# Patient Record
Sex: Female | Born: 1975 | Race: White | Hispanic: No | Smoking: Former smoker
Health system: Southern US, Community
[De-identification: ages and names within clinical notes are randomized; demographics above are authoritative.]

## PROBLEM LIST (undated history)

## (undated) ENCOUNTER — Inpatient Hospital Stay (HOSPITAL_COMMUNITY): Payer: Self-pay

## (undated) DIAGNOSIS — K589 Irritable bowel syndrome without diarrhea: Secondary | ICD-10-CM

## (undated) DIAGNOSIS — R51 Headache: Secondary | ICD-10-CM

## (undated) HISTORY — DX: Headache: R51

## (undated) HISTORY — PX: CYST REMOVAL HAND: SHX6279

## (undated) HISTORY — PX: CHOLECYSTECTOMY: SHX55

---

## 1997-09-30 ENCOUNTER — Inpatient Hospital Stay (HOSPITAL_COMMUNITY): Admission: EM | Admit: 1997-09-30 | Discharge: 1997-10-02 | Payer: Self-pay | Admitting: Emergency Medicine

## 1998-12-22 ENCOUNTER — Encounter: Payer: Self-pay | Admitting: *Deleted

## 1998-12-22 ENCOUNTER — Ambulatory Visit (HOSPITAL_COMMUNITY): Admission: RE | Admit: 1998-12-22 | Discharge: 1998-12-22 | Payer: Self-pay | Admitting: *Deleted

## 1999-01-08 ENCOUNTER — Inpatient Hospital Stay (HOSPITAL_COMMUNITY): Admission: AD | Admit: 1999-01-08 | Discharge: 1999-01-08 | Payer: Self-pay | Admitting: *Deleted

## 1999-03-20 ENCOUNTER — Inpatient Hospital Stay (HOSPITAL_COMMUNITY): Admission: AD | Admit: 1999-03-20 | Discharge: 1999-03-22 | Payer: Self-pay | Admitting: *Deleted

## 1999-03-20 ENCOUNTER — Encounter (HOSPITAL_COMMUNITY): Admission: RE | Admit: 1999-03-20 | Discharge: 1999-03-20 | Payer: Self-pay | Admitting: *Deleted

## 1999-03-20 ENCOUNTER — Inpatient Hospital Stay (HOSPITAL_COMMUNITY): Admission: AD | Admit: 1999-03-20 | Discharge: 1999-03-20 | Payer: Self-pay | Admitting: *Deleted

## 2000-10-29 ENCOUNTER — Emergency Department (HOSPITAL_COMMUNITY): Admission: EM | Admit: 2000-10-29 | Discharge: 2000-10-30 | Payer: Self-pay | Admitting: Emergency Medicine

## 2000-10-30 ENCOUNTER — Encounter: Payer: Self-pay | Admitting: Emergency Medicine

## 2000-10-30 ENCOUNTER — Ambulatory Visit (HOSPITAL_COMMUNITY): Admission: RE | Admit: 2000-10-30 | Discharge: 2000-10-30 | Payer: Self-pay | Admitting: Emergency Medicine

## 2000-11-02 ENCOUNTER — Observation Stay (HOSPITAL_COMMUNITY): Admission: RE | Admit: 2000-11-02 | Discharge: 2000-11-03 | Payer: Self-pay | Admitting: General Surgery

## 2002-01-25 HISTORY — PX: ANKLE SURGERY: SHX546

## 2004-02-29 ENCOUNTER — Inpatient Hospital Stay (HOSPITAL_COMMUNITY): Admission: EM | Admit: 2004-02-29 | Discharge: 2004-03-02 | Payer: Self-pay | Admitting: Emergency Medicine

## 2005-08-23 ENCOUNTER — Inpatient Hospital Stay (HOSPITAL_COMMUNITY): Admission: AD | Admit: 2005-08-23 | Discharge: 2005-08-23 | Payer: Self-pay | Admitting: Gynecology

## 2005-10-19 ENCOUNTER — Ambulatory Visit (HOSPITAL_COMMUNITY): Admission: RE | Admit: 2005-10-19 | Discharge: 2005-10-19 | Payer: Self-pay | Admitting: Obstetrics & Gynecology

## 2006-03-07 ENCOUNTER — Ambulatory Visit (HOSPITAL_COMMUNITY): Admission: RE | Admit: 2006-03-07 | Discharge: 2006-03-07 | Payer: Self-pay | Admitting: Obstetrics & Gynecology

## 2006-03-13 ENCOUNTER — Ambulatory Visit: Payer: Self-pay | Admitting: Obstetrics and Gynecology

## 2006-03-13 ENCOUNTER — Inpatient Hospital Stay (HOSPITAL_COMMUNITY): Admission: AD | Admit: 2006-03-13 | Discharge: 2006-03-15 | Payer: Self-pay | Admitting: Obstetrics and Gynecology

## 2010-11-22 ENCOUNTER — Inpatient Hospital Stay (HOSPITAL_COMMUNITY): Payer: Medicaid Other

## 2010-11-22 ENCOUNTER — Encounter (HOSPITAL_COMMUNITY): Payer: Self-pay | Admitting: *Deleted

## 2010-11-22 ENCOUNTER — Inpatient Hospital Stay (HOSPITAL_COMMUNITY)
Admission: AD | Admit: 2010-11-22 | Discharge: 2010-11-22 | Disposition: A | Discharge status: Home / Self Care | Payer: Medicaid Other | Source: Ambulatory Visit | Attending: Obstetrics & Gynecology | Admitting: Obstetrics & Gynecology

## 2010-11-22 DIAGNOSIS — O200 Threatened abortion: Secondary | ICD-10-CM

## 2010-11-22 DIAGNOSIS — O26859 Spotting complicating pregnancy, unspecified trimester: Secondary | ICD-10-CM | POA: Insufficient documentation

## 2010-11-22 HISTORY — DX: Irritable bowel syndrome, unspecified: K58.9

## 2010-11-22 LAB — ABO/RH: ABO/RH(D): A POS

## 2010-11-22 LAB — URINALYSIS, ROUTINE W REFLEX MICROSCOPIC
Bilirubin Urine: NEGATIVE
Leukocytes, UA: NEGATIVE
Nitrite: NEGATIVE
Specific Gravity, Urine: 1.01 (ref 1.005–1.030)
Urobilinogen, UA: 0.2 mg/dL (ref 0.0–1.0)
pH: 7 (ref 5.0–8.0)

## 2010-11-22 LAB — WET PREP, GENITAL
Trich, Wet Prep: NONE SEEN
Yeast Wet Prep HPF POC: NONE SEEN

## 2010-11-22 LAB — POCT PREGNANCY, URINE: Preg Test, Ur: POSITIVE

## 2010-11-22 NOTE — Progress Notes (Signed)
Pt presents to [facility] with chief complaint of brown spotting, left sided pain. Pt is early pregnant, 6 weeks.

## 2010-11-22 NOTE — Progress Notes (Signed)
Pt reprots having some spotting today when she wiped and having some back pain /cramping

## 2010-11-22 NOTE — ED Provider Notes (Signed)
History   Christine Chen is a 35 y.o. year old [id number] female at [redacted]w[redacted]d weeks gestation  By LMP who presents to [facility] reporting spotting and left sided and LBP. She denies passage of tissue. She has not had any US's this pregnancy.   CSN: [id number] Arrival date & time: 11/22/2010 11:01 AM   None     Chief Complaint  Patient presents with  . Vaginal Bleeding   (Consider location/radiation/quality/duration/timing/severity/associated sxs/prior treatment) HPI  Past Medical History  Diagnosis Date  . IBS (irritable bowel syndrome)     Past Surgical History  Procedure Date  . Cholecystectomy     No family history on file.  History  Substance Use Topics  . Smoking status: Current Everyday Smoker -- 0.2 packs/day  . Smokeless tobacco: Not on file  . Alcohol Use: No    OB History    Grav Para Term Preterm Abortions TAB SAB Ect Mult Living   5 3 3  0 1 0 1 0 0 3      Review of Systems: neg for urgency, frequency, dysuria, fever, chills, abd pain  Allergies  Review of patient's allergies indicates no known allergies.  Home Medications  No current outpatient prescriptions on file.  BP 132/80  Pulse 79  Temp(Src) 98.1 F (36.7 C) (Oral)  Resp 18  Ht 5\' 2"  (1.575 m)  Wt 90.719 kg (200 lb)  BMI 36.58 kg/m2  LMP 10/08/2010  Physical Exam General: NAD, A&O x 4 Abd, NT Pelvic: BUS normal. Vagina + scant brown discharge, no odor. Cervix closed, non-friable. No adnexal masses or tenderness.  US Ob Transvaginal  11/22/2010  *RADIOLOGY REPORT*  Clinical Data: Pregnant, spotting, left lower quadrant pain  OBSTETRIC <14 WK [place] AND TRANSVAGINAL OB US  Technique:  Both transabdominal and transvaginal ultrasound examinations were performed for complete evaluation of the gestation as well as the maternal uterus, adnexal regions, and pelvic cul-de-sac.  Transvaginal technique was performed to assess early pregnancy.  Comparison:  None.  Intrauterine gestational sac:   Visualized/normal in shape. Yolk sac: Present Embryo: Not visualized  MSD: 5.4 mm, corresponding to an estimated gestational age [redacted] weeks 1 day.  [place] EDC: 07/24/2011  Maternal uterus/adnexae: Gravid uterus is notable for a 1.3 x 1.2 x 1.8 cm intramural posterior uterine body fibroid.  No subchorionic hemorrhage.  Left ovary measures 1.7 x 2.0 x 2.7 cm and is notable for a corpus luteal cyst.  Right ovary measures 1.5 x 2.2 x 3.0 cm and is within normal limits.  No free fluid.  IMPRESSION: Single intrauterine gestational sac with estimated gestational age [redacted] weeks 1 day by crown-rump length.  Original Report Authenticated By: Charline Bills, M.D.    ED Course  Procedures (including critical care time)  Results for orders placed during the hospital encounter of 11/22/10 (from the past 48 hour(s))  URINALYSIS, ROUTINE W REFLEX MICROSCOPIC     Status: Normal   Collection Time   11/22/10 11:35 AM      Component Value Range Comment   Color, Urine YELLOW  YELLOW     Appearance CLEAR  CLEAR     Specific Gravity, Urine 1.010  1.005 - 1.030     pH 7.0  5.0 - 8.0     Glucose, UA NEGATIVE  NEGATIVE (mg/dL)    Hgb urine dipstick NEGATIVE  NEGATIVE     Bilirubin Urine NEGATIVE  NEGATIVE     Ketones, ur NEGATIVE  NEGATIVE (mg/dL)    Protein, ur NEGATIVE  NEGATIVE (mg/dL)    Urobilinogen, UA 0.2  0.0 - 1.0 (mg/dL)    Nitrite NEGATIVE  NEGATIVE     Leukocytes, UA NEGATIVE  NEGATIVE  MICROSCOPIC NOT DONE ON URINES WITH NEGATIVE PROTEIN, BLOOD, LEUKOCYTES, NITRITE, OR GLUCOSE <1000 mg/dL.  HCG, QUANTITATIVE, PREGNANCY     Status: Abnormal   Collection Time   11/22/10 11:41 AM      Component Value Range Comment   hCG, Beta Chain, Quant, S 3646 (*) <5 (mIU/mL)   ABO/RH     Status: Normal   Collection Time   11/22/10 11:41 AM      Component Value Range Comment   ABO/RH(D) A POS     POCT PREGNANCY, URINE     Status: Normal   Collection Time   11/22/10 11:42 AM      Component Value Range Comment    Preg Test, Ur POSITIVE     WET PREP, GENITAL     Status: Abnormal   Collection Time   11/22/10  2:21 PM      Component Value Range Comment   Yeast, Wet Prep NONE SEEN  NONE SEEN     Trich, Wet Prep NONE SEEN  NONE SEEN     Clue Cells, Wet Prep NONE SEEN  NONE SEEN     WBC, Wet Prep HPF POC MODERATE (*) NONE SEEN  MANY BACTERIA SEEN     MDM  Assessment: 1. 5.1 week IUP, viability not yet confirmed 2. Spotting of unknown etiology  Plan: 1. D/C home 2. Start prenatal care 3. F/U in [facility] PRN for worsening of Sx. 4. SAB precautions 5. GC/CT pending 6. Pelvic rest x 1 week  Dorathy Kinsman 11/22/2010 5:11 PM

## 2011-02-08 ENCOUNTER — Other Ambulatory Visit: Payer: Self-pay | Admitting: Family Medicine

## 2011-02-08 ENCOUNTER — Ambulatory Visit: Payer: BLUE CROSS/BLUE SHIELD | Admitting: Gynecology

## 2011-02-08 VITALS — BP 136/78 | Wt 210.0 lb

## 2011-02-08 DIAGNOSIS — O09519 Supervision of elderly primigravida, unspecified trimester: Secondary | ICD-10-CM

## 2011-02-08 DIAGNOSIS — Z3480 Encounter for supervision of other normal pregnancy, unspecified trimester: Secondary | ICD-10-CM

## 2011-02-09 LAB — OBSTETRIC PANEL
Basophils Relative: 0 % (ref 0–1)
Eosinophils Absolute: 0.1 10*3/uL (ref 0.0–0.7)
Eosinophils Relative: 2 % (ref 0–5)
Lymphs Abs: 1.6 10*3/uL (ref 0.7–4.0)
MCH: 28 pg (ref 26.0–34.0)
MCHC: 32.2 g/dL (ref 30.0–36.0)
MCV: 87.1 fL (ref 78.0–100.0)
Platelets: 211 10*3/uL (ref 150–400)
RBC: 4.25 MIL/uL (ref 3.87–5.11)
Rh Type: POSITIVE

## 2011-02-09 LAB — HEPATITIS B SURFACE ANTIBODY,QUALITATIVE: Hep B S Ab: NEGATIVE

## 2011-02-09 LAB — HIV ANTIBODY (ROUTINE TESTING W REFLEX): HIV: NONREACTIVE

## 2011-02-10 LAB — CULTURE, URINE COMPREHENSIVE: Colony Count: NO GROWTH

## 2011-02-15 ENCOUNTER — Telehealth: Payer: Self-pay | Admitting: *Deleted

## 2011-02-16 ENCOUNTER — Ambulatory Visit (INDEPENDENT_AMBULATORY_CARE_PROVIDER_SITE_OTHER): Payer: Medicaid Other | Admitting: Obstetrics and Gynecology

## 2011-02-16 ENCOUNTER — Other Ambulatory Visit (HOSPITAL_COMMUNITY)
Admission: RE | Admit: 2011-02-16 | Discharge: 2011-02-16 | Disposition: A | Discharge status: Home / Self Care | Payer: Medicaid Other | Source: Ambulatory Visit | Attending: Obstetrics and Gynecology | Admitting: Obstetrics and Gynecology

## 2011-02-16 VITALS — BP 113/77 | Wt 209.0 lb

## 2011-02-16 DIAGNOSIS — Z113 Encounter for screening for infections with a predominantly sexual mode of transmission: Secondary | ICD-10-CM | POA: Insufficient documentation

## 2011-02-16 DIAGNOSIS — Z01419 Encounter for gynecological examination (general) (routine) without abnormal findings: Secondary | ICD-10-CM | POA: Insufficient documentation

## 2011-02-16 DIAGNOSIS — Z3480 Encounter for supervision of other normal pregnancy, unspecified trimester: Secondary | ICD-10-CM

## 2011-02-16 DIAGNOSIS — O09529 Supervision of elderly multigravida, unspecified trimester: Secondary | ICD-10-CM | POA: Insufficient documentation
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
## 2011-02-16 NOTE — Patient Instructions (Signed)
Pregnancy - Second Trimester The second trimester of pregnancy (3 to 6 months) is a period of rapid growth for you and your baby. At the end of the sixth month, your baby is about 9 inches long and weighs 1 1/2 pounds. You will begin to feel the baby move between 18 and 20 weeks of the pregnancy. This is called quickening. Weight gain is faster. A clear fluid (colostrum) may leak out of your breasts. You may feel small contractions of the womb (uterus). This is known as false labor or Braxton-Hicks contractions. This is like a practice for labor when the baby is ready to be born. Usually, the problems with morning sickness have usually passed by the end of your first trimester. Some women develop small dark blotches (called cholasma, mask of pregnancy) on their face that usually goes away after the baby is born. Exposure to the sun makes the blotches worse. Acne may also develop in some pregnant women and pregnant women who have acne, may find that it goes away. PRENATAL EXAMS  Blood work may continue to be done during prenatal exams. These tests are done to check on your health and the probable health of your baby. Blood work is used to follow your blood levels (hemoglobin). Anemia (low hemoglobin) is common during pregnancy. Iron and vitamins are given to help prevent this. You will also be checked for diabetes between 24 and 28 weeks of the pregnancy. Some of the previous blood tests may be repeated.   The size of the uterus is measured during each visit. This is to make sure that the baby is continuing to grow properly according to the dates of the pregnancy.   Your blood pressure is checked every prenatal visit. This is to make sure you are not getting toxemia.   Your urine is checked to make sure you do not have an infection, diabetes or protein in the urine.   Your weight is checked often to make sure gains are happening at the suggested rate. This is to ensure that both you and your baby are  growing normally.   Sometimes, an ultrasound is performed to confirm the proper growth and development of the baby. This is a test which bounces harmless sound waves off the baby so your caregiver can more accurately determine due dates.  Sometimes, a specialized test is done on the amniotic fluid surrounding the baby. This test is called an amniocentesis. The amniotic fluid is obtained by sticking a needle into the belly (abdomen). This is done to check the chromosomes in instances where there is a concern about possible genetic problems with the baby. It is also sometimes done near the end of pregnancy if an early delivery is required. In this case, it is done to help make sure the baby's lungs are mature enough for the baby to live outside of the womb. CHANGES OCCURING IN THE SECOND TRIMESTER OF PREGNANCY Your body goes through many changes during pregnancy. They vary from person to person. Talk to your caregiver about changes you notice that you are concerned about.  During the second trimester, you will likely have an increase in your appetite. It is normal to have cravings for certain foods. This varies from person to person and pregnancy to pregnancy.   Your lower abdomen will begin to bulge.   You may have to urinate more often because the uterus and baby are pressing on your bladder. It is also common to get more bladder infections during pregnancy (  pain with urination). You can help this by drinking lots of fluids and emptying your bladder before and after intercourse.   You may begin to get stretch marks on your hips, abdomen, and breasts. These are normal changes in the body during pregnancy. There are no exercises or medications to take that prevent this change.   You may begin to develop swollen and bulging veins (varicose veins) in your legs. Wearing support hose, elevating your feet for 15 minutes, 3 to 4 times a day and limiting salt in your diet helps lessen the problem.    Heartburn may develop as the uterus grows and pushes up against the stomach. Antacids recommended by your caregiver helps with this problem. Also, eating smaller meals 4 to 5 times a day helps.   Constipation can be treated with a stool softener or adding bulk to your diet. Drinking lots of fluids, vegetables, fruits, and whole grains are helpful.   Exercising is also helpful. If you have been very active up until your pregnancy, most of these activities can be continued during your pregnancy. If you have been less active, it is helpful to start an exercise program such as walking.   Hemorrhoids (varicose veins in the rectum) may develop at the end of the second trimester. Warm sitz baths and hemorrhoid cream recommended by your caregiver helps hemorrhoid problems.   Backaches may develop during this time of your pregnancy. Avoid heavy lifting, wear low heal shoes and practice good posture to help with backache problems.   Some pregnant women develop tingling and numbness of their hand and fingers because of swelling and tightening of ligaments in the wrist (carpel tunnel syndrome). This goes away after the baby is born.   As your breasts enlarge, you may have to get a bigger bra. Get a comfortable, cotton, support bra. Do not get a nursing bra until the last month of the pregnancy if you will be nursing the baby.   You may get a dark line from your belly button to the pubic area called the linea nigra.   You may develop rosy cheeks because of increase blood flow to the face.   You may develop spider looking lines of the face, neck, arms and chest. These go away after the baby is born.  HOME CARE INSTRUCTIONS   It is extremely important to avoid all smoking, herbs, alcohol, and unprescribed drugs during your pregnancy. These chemicals affect the formation and growth of the baby. Avoid these chemicals throughout the pregnancy to ensure the delivery of a healthy infant.   Most of your home  care instructions are the same as suggested for the first trimester of your pregnancy. Keep your caregiver's appointments. Follow your caregiver's instructions regarding medication use, exercise and diet.   During pregnancy, you are providing food for you and your baby. Continue to eat regular, well-balanced meals. Choose foods such as meat, fish, milk and other low fat dairy products, vegetables, fruits, and whole-grain breads and cereals. Your caregiver will tell you of the ideal weight gain.   A physical sexual relationship may be continued up until near the end of pregnancy if there are no other problems. Problems could include early (premature) leaking of amniotic fluid from the membranes, vaginal bleeding, abdominal pain, or other medical or pregnancy problems.   Exercise regularly if there are no restrictions. Check with your caregiver if you are unsure of the safety of some of your exercises. The greatest weight gain will occur in the   last 2 trimesters of pregnancy. Exercise will help you:   Control your weight.   Get you in shape for labor and delivery.   Lose weight after you have the baby.   Wear a good support or jogging bra for breast tenderness during pregnancy. This may help if worn during sleep. Pads or tissues may be used in the bra if you are leaking colostrum.   Do not use hot tubs, steam rooms or saunas throughout the pregnancy.   Wear your seat belt at all times when driving. This protects you and your baby if you are in an accident.   Avoid raw meat, uncooked cheese, cat litter boxes and soil used by cats. These carry germs that can cause birth defects in the baby.   The second trimester is also a good time to visit your dentist for your dental health if this has not been done yet. Getting your teeth cleaned is OK. Use a soft toothbrush. Brush gently during pregnancy.   It is easier to loose urine during pregnancy. Tightening up and strengthening the pelvic muscles will  help with this problem. Practice stopping your urination while you are going to the bathroom. These are the same muscles you need to strengthen. It is also the muscles you would use as if you were trying to stop from passing gas. You can practice tightening these muscles up 10 times a set and repeating this about 3 times per day. Once you know what muscles to tighten up, do not perform these exercises during urination. It is more likely to contribute to an infection by backing up the urine.   Ask for help if you have financial, counseling or nutritional needs during pregnancy. Your caregiver will be able to offer counseling for these needs as well as refer you for other special needs.   Your skin may become oily. If so, wash your face with mild soap, use non-greasy moisturizer and oil or cream based makeup.  MEDICATIONS AND DRUG USE IN PREGNANCY  Take prenatal vitamins as directed. The vitamin should contain 1 milligram of folic acid. Keep all vitamins out of reach of children. Only a couple vitamins or tablets containing iron may be fatal to a baby or young child when ingested.   Avoid use of all medications, including herbs, over-the-counter medications, not prescribed or suggested by your caregiver. Only take over-the-counter or prescription medicines for pain, discomfort, or fever as directed by your caregiver. Do not use aspirin.   Let your caregiver also know about herbs you may be using.   Alcohol is related to a number of birth defects. This includes fetal alcohol syndrome. All alcohol, in any form, should be avoided completely. Smoking will cause low birth rate and premature babies.   Street or illegal drugs are very harmful to the baby. They are absolutely forbidden. A baby born to an addicted mother will be addicted at birth. The baby will go through the same withdrawal an adult does.  SEEK MEDICAL CARE IF:  You have any concerns or worries during your pregnancy. It is better to call with  your questions if you feel they cannot wait, rather than worry about them. SEEK IMMEDIATE MEDICAL CARE IF:   An unexplained oral temperature above 102 F (38.9 C) develops, or as your caregiver suggests.   You have leaking of fluid from the vagina (birth canal). If leaking membranes are suspected, take your temperature and tell your caregiver of this when you call.   There   is vaginal spotting, bleeding, or passing clots. Tell your caregiver of the amount and how many pads are used. Light spotting in pregnancy is common, especially following intercourse.   You develop a bad smelling vaginal discharge with a change in the color from clear to white.   You continue to feel sick to your stomach (nauseated) and have no relief from remedies suggested. You vomit blood or coffee ground-like materials.   You lose more than 2 pounds of weight or gain more than 2 pounds of weight over 1 week, or as suggested by your caregiver.   You notice swelling of your face, hands, feet, or legs.   You get exposed to German measles and have never had them.   You are exposed to fifth disease or chickenpox.   You develop belly (abdominal) pain. Round ligament discomfort is a common non-cancerous (benign) cause of abdominal pain in pregnancy. Your caregiver still must evaluate you.   You develop a bad headache that does not go away.   You develop fever, diarrhea, pain with urination, or shortness of breath.   You develop visual problems, blurry, or double vision.   You fall or are in a car accident or any kind of trauma.   There is mental or physical violence at home.  Document Released: 01/05/2001 Document Revised: 09/23/2010 Document Reviewed: 07/10/2008 [employer] Patient Information 2012 [employer]. 

## 2011-02-17 NOTE — Progress Notes (Signed)
Added AFP Quad screen, test ordered was AFP only.

## 2011-02-19 LAB — AFP, QUAD SCREEN
AFP: 38.5 IU/mL
Down Syndrome Scr Risk Est: 1:7850 {titer}
HCG, Total: 13695 m[IU]/mL
Interpretation-AFP: NEGATIVE
MoM for AFP: 1.29
MoM for hCG: 0.98
Open Spina bifida: NEGATIVE
Tri 18 Scr Risk Est: NEGATIVE
uE3 Mom: 1.03

## 2011-02-25 NOTE — Telephone Encounter (Signed)
Quad screen was added to patients lab/ results are to be faxed over by the lab.

## 2011-03-01 ENCOUNTER — Ambulatory Visit (HOSPITAL_COMMUNITY)
Admission: RE | Admit: 2011-03-01 | Discharge: 2011-03-01 | Disposition: A | Discharge status: Home / Self Care | Payer: Medicaid Other | Source: Ambulatory Visit | Attending: Obstetrics and Gynecology | Admitting: Obstetrics and Gynecology

## 2011-03-01 DIAGNOSIS — Z363 Encounter for antenatal screening for malformations: Secondary | ICD-10-CM | POA: Insufficient documentation

## 2011-03-01 DIAGNOSIS — O358XX0 Maternal care for other (suspected) fetal abnormality and damage, not applicable or unspecified: Secondary | ICD-10-CM | POA: Insufficient documentation

## 2011-03-01 DIAGNOSIS — Z3480 Encounter for supervision of other normal pregnancy, unspecified trimester: Secondary | ICD-10-CM

## 2011-03-01 DIAGNOSIS — Z1389 Encounter for screening for other disorder: Secondary | ICD-10-CM | POA: Insufficient documentation

## 2011-03-04 ENCOUNTER — Encounter: Payer: Self-pay | Admitting: Obstetrics and Gynecology

## 2011-03-08 ENCOUNTER — Encounter (HOSPITAL_COMMUNITY): Payer: Self-pay | Admitting: *Deleted

## 2011-03-08 ENCOUNTER — Inpatient Hospital Stay (HOSPITAL_COMMUNITY)
Admission: AD | Admit: 2011-03-08 | Discharge: 2011-03-08 | Disposition: A | Discharge status: Home / Self Care | Payer: Medicaid Other | Source: Ambulatory Visit | Attending: Obstetrics & Gynecology | Admitting: Obstetrics & Gynecology

## 2011-03-08 DIAGNOSIS — M7918 Myalgia, other site: Secondary | ICD-10-CM

## 2011-03-08 DIAGNOSIS — M549 Dorsalgia, unspecified: Secondary | ICD-10-CM | POA: Insufficient documentation

## 2011-03-08 DIAGNOSIS — O99891 Other specified diseases and conditions complicating pregnancy: Secondary | ICD-10-CM | POA: Insufficient documentation

## 2011-03-08 DIAGNOSIS — IMO0001 Reserved for inherently not codable concepts without codable children: Secondary | ICD-10-CM

## 2011-03-08 LAB — URINALYSIS, ROUTINE W REFLEX MICROSCOPIC
Glucose, UA: NEGATIVE mg/dL
Hgb urine dipstick: NEGATIVE
Ketones, ur: >80 mg/dL — AB
Leukocytes, UA: NEGATIVE
pH: 5.5 (ref 5.0–8.0)

## 2011-03-08 MED ORDER — CYCLOBENZAPRINE HCL 5 MG PO TABS: 5.0000 mg | ORAL_TABLET | Freq: Two times a day (BID) | ORAL | Status: AC | PRN

## 2011-03-08 MED ORDER — IBUPROFEN 600 MG PO TABS: 600.0000 mg | ORAL_TABLET | Freq: Four times a day (QID) | ORAL | Status: AC | PRN
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
## 2011-03-08 NOTE — Progress Notes (Signed)
Back pains, initially started on Sat.  Has continued, worse with movement.  Has tried tylenol, no relief.  Denies any urinary symptoms.

## 2011-03-16 ENCOUNTER — Encounter: Payer: Self-pay | Admitting: Obstetrics and Gynecology

## 2011-03-17 ENCOUNTER — Ambulatory Visit (INDEPENDENT_AMBULATORY_CARE_PROVIDER_SITE_OTHER): Payer: Medicaid Other | Admitting: Obstetrics & Gynecology

## 2011-03-17 VITALS — BP 101/61 | Wt 213.0 lb

## 2011-03-17 DIAGNOSIS — Z3480 Encounter for supervision of other normal pregnancy, unspecified trimester: Secondary | ICD-10-CM

## 2011-03-17 NOTE — Patient Instructions (Signed)
Return to clinic for any obstetric concerns or go to [facility]U for evaluation  

## 2011-03-17 NOTE — Progress Notes (Signed)
Patient is here for routine prenatal check she was seen at the hospital for a pulled muscle in her back this past Monday.  She was seen at [facility] hospital.

## 2011-03-17 NOTE — Progress Notes (Signed)
Back pain being treated with Flexeril and analgesia.  No other complaints or concerns.  Preterm labor precautions reviewed.

## 2011-04-13 ENCOUNTER — Ambulatory Visit (INDEPENDENT_AMBULATORY_CARE_PROVIDER_SITE_OTHER): Payer: Medicaid Other | Admitting: Family Medicine

## 2011-04-13 DIAGNOSIS — Z3480 Encounter for supervision of other normal pregnancy, unspecified trimester: Secondary | ICD-10-CM

## 2011-04-13 NOTE — Progress Notes (Signed)
Doing well---No issues, 28 wk labs next.  Interested in BTL, will sign papers next visit.

## 2011-04-13 NOTE — Patient Instructions (Addendum)
Pregnancy - Second Trimester The second trimester of pregnancy (3 to 6 months) is a period of rapid growth for you and your baby. At the end of the sixth month, your baby is about 9 inches long and weighs 1 1/2 pounds. You will begin to feel the baby move between 18 and 20 weeks of the pregnancy. This is called quickening. Weight gain is faster. A clear fluid (colostrum) may leak out of your breasts. You may feel small contractions of the womb (uterus). This is known as false labor or Braxton-Hicks contractions. This is like a practice for labor when the baby is ready to be born. Usually, the problems with morning sickness have usually passed by the end of your first trimester. Some women develop small dark blotches (called cholasma, mask of pregnancy) on their face that usually goes away after the baby is born. Exposure to the sun makes the blotches worse. Acne may also develop in some pregnant women and pregnant women who have acne, may find that it goes away. PRENATAL EXAMS  Blood work may continue to be done during prenatal exams. These tests are done to check on your health and the probable health of your baby. Blood work is used to follow your blood levels (hemoglobin). Anemia (low hemoglobin) is common during pregnancy. Iron and vitamins are given to help prevent this. You will also be checked for diabetes between 24 and 28 weeks of the pregnancy. Some of the previous blood tests may be repeated.   The size of the uterus is measured during each visit. This is to make sure that the baby is continuing to grow properly according to the dates of the pregnancy.   Your blood pressure is checked every prenatal visit. This is to make sure you are not getting toxemia.   Your urine is checked to make sure you do not have an infection, diabetes or protein in the urine.   Your weight is checked often to make sure gains are happening at the suggested rate. This is to ensure that both you and your baby are  growing normally.   Sometimes, an ultrasound is performed to confirm the proper growth and development of the baby. This is a test which bounces harmless sound waves off the baby so your caregiver can more accurately determine due dates.  Sometimes, a specialized test is done on the amniotic fluid surrounding the baby. This test is called an amniocentesis. The amniotic fluid is obtained by sticking a needle into the belly (abdomen). This is done to check the chromosomes in instances where there is a concern about possible genetic problems with the baby. It is also sometimes done near the end of pregnancy if an early delivery is required. In this case, it is done to help make sure the baby's lungs are mature enough for the baby to live outside of the womb. CHANGES OCCURING IN THE SECOND TRIMESTER OF PREGNANCY Your body goes through many changes during pregnancy. They vary from person to person. Talk to your caregiver about changes you notice that you are concerned about.  During the second trimester, you will likely have an increase in your appetite. It is normal to have cravings for certain foods. This varies from person to person and pregnancy to pregnancy.   Your lower abdomen will begin to bulge.   You may have to urinate more often because the uterus and baby are pressing on your bladder. It is also common to get more bladder infections during pregnancy (  pain with urination). You can help this by drinking lots of fluids and emptying your bladder before and after intercourse.   You may begin to get stretch marks on your hips, abdomen, and breasts. These are normal changes in the body during pregnancy. There are no exercises or medications to take that prevent this change.   You may begin to develop swollen and bulging veins (varicose veins) in your legs. Wearing support hose, elevating your feet for 15 minutes, 3 to 4 times a day and limiting salt in your diet helps lessen the problem.    Heartburn may develop as the uterus grows and pushes up against the stomach. Antacids recommended by your caregiver helps with this problem. Also, eating smaller meals 4 to 5 times a day helps.   Constipation can be treated with a stool softener or adding bulk to your diet. Drinking lots of fluids, vegetables, fruits, and whole grains are helpful.   Exercising is also helpful. If you have been very active up until your pregnancy, most of these activities can be continued during your pregnancy. If you have been less active, it is helpful to start an exercise program such as walking.   Hemorrhoids (varicose veins in the rectum) may develop at the end of the second trimester. Warm sitz baths and hemorrhoid cream recommended by your caregiver helps hemorrhoid problems.   Backaches may develop during this time of your pregnancy. Avoid heavy lifting, wear low heal shoes and practice good posture to help with backache problems.   Some pregnant women develop tingling and numbness of their hand and fingers because of swelling and tightening of ligaments in the wrist (carpel tunnel syndrome). This goes away after the baby is born.   As your breasts enlarge, you may have to get a bigger bra. Get a comfortable, cotton, support bra. Do not get a nursing bra until the last month of the pregnancy if you will be nursing the baby.   You may get a dark line from your belly button to the pubic area called the linea nigra.   You may develop rosy cheeks because of increase blood flow to the face.   You may develop spider looking lines of the face, neck, arms and chest. These go away after the baby is born.  HOME CARE INSTRUCTIONS   It is extremely important to avoid all smoking, herbs, alcohol, and unprescribed drugs during your pregnancy. These chemicals affect the formation and growth of the baby. Avoid these chemicals throughout the pregnancy to ensure the delivery of a healthy infant.   Most of your home  care instructions are the same as suggested for the first trimester of your pregnancy. Keep your caregiver's appointments. Follow your caregiver's instructions regarding medication use, exercise and diet.   During pregnancy, you are providing food for you and your baby. Continue to eat regular, well-balanced meals. Choose foods such as meat, fish, milk and other low fat dairy products, vegetables, fruits, and whole-grain breads and cereals. Your caregiver will tell you of the ideal weight gain.   A physical sexual relationship may be continued up until near the end of pregnancy if there are no other problems. Problems could include early (premature) leaking of amniotic fluid from the membranes, vaginal bleeding, abdominal pain, or other medical or pregnancy problems.   Exercise regularly if there are no restrictions. Check with your caregiver if you are unsure of the safety of some of your exercises. The greatest weight gain will occur in the   last 2 trimesters of pregnancy. Exercise will help you:   Control your weight.   Get you in shape for labor and delivery.   Lose weight after you have the baby.   Wear a good support or jogging bra for breast tenderness during pregnancy. This may help if worn during sleep. Pads or tissues may be used in the bra if you are leaking colostrum.   Do not use hot tubs, steam rooms or saunas throughout the pregnancy.   Wear your seat belt at all times when driving. This protects you and your baby if you are in an accident.   Avoid raw meat, uncooked cheese, cat litter boxes and soil used by cats. These carry germs that can cause birth defects in the baby.   The second trimester is also a good time to visit your dentist for your dental health if this has not been done yet. Getting your teeth cleaned is OK. Use a soft toothbrush. Brush gently during pregnancy.   It is easier to loose urine during pregnancy. Tightening up and strengthening the pelvic muscles will  help with this problem. Practice stopping your urination while you are going to the bathroom. These are the same muscles you need to strengthen. It is also the muscles you would use as if you were trying to stop from passing gas. You can practice tightening these muscles up 10 times a set and repeating this about 3 times per day. Once you know what muscles to tighten up, do not perform these exercises during urination. It is more likely to contribute to an infection by backing up the urine.   Ask for help if you have financial, counseling or nutritional needs during pregnancy. Your caregiver will be able to offer counseling for these needs as well as refer you for other special needs.   Your skin may become oily. If so, wash your face with mild soap, use non-greasy moisturizer and oil or cream based makeup.  MEDICATIONS AND DRUG USE IN PREGNANCY  Take prenatal vitamins as directed. The vitamin should contain 1 milligram of folic acid. Keep all vitamins out of reach of children. Only a couple vitamins or tablets containing iron may be fatal to a baby or young child when ingested.   Avoid use of all medications, including herbs, over-the-counter medications, not prescribed or suggested by your caregiver. Only take over-the-counter or prescription medicines for pain, discomfort, or fever as directed by your caregiver. Do not use aspirin.   Let your caregiver also know about herbs you may be using.   Alcohol is related to a number of birth defects. This includes fetal alcohol syndrome. All alcohol, in any form, should be avoided completely. Smoking will cause low birth rate and premature babies.   Street or illegal drugs are very harmful to the baby. They are absolutely forbidden. A baby born to an addicted mother will be addicted at birth. The baby will go through the same withdrawal an adult does.  SEEK MEDICAL CARE IF:  You have any concerns or worries during your pregnancy. It is better to call with  your questions if you feel they cannot wait, rather than worry about them. SEEK IMMEDIATE MEDICAL CARE IF:   An unexplained oral temperature above 102 F (38.9 C) develops, or as your caregiver suggests.   You have leaking of fluid from the vagina (birth canal). If leaking membranes are suspected, take your temperature and tell your caregiver of this when you call.   There   is vaginal spotting, bleeding, or passing clots. Tell your caregiver of the amount and how many pads are used. Light spotting in pregnancy is common, especially following intercourse.   You develop a bad smelling vaginal discharge with a change in the color from clear to white.   You continue to feel sick to your stomach (nauseated) and have no relief from remedies suggested. You vomit blood or coffee ground-like materials.   You lose more than 2 pounds of weight or gain more than 2 pounds of weight over 1 week, or as suggested by your caregiver.   You notice swelling of your face, hands, feet, or legs.   You get exposed to German measles and have never had them.   You are exposed to fifth disease or chickenpox.   You develop belly (abdominal) pain. Round ligament discomfort is a common non-cancerous (benign) cause of abdominal pain in pregnancy. Your caregiver still must evaluate you.   You develop a bad headache that does not go away.   You develop fever, diarrhea, pain with urination, or shortness of breath.   You develop visual problems, blurry, or double vision.   You fall or are in a car accident or any kind of trauma.   There is mental or physical violence at home.  Document Released: 01/05/2001 Document Revised: 12/31/2010 Document Reviewed: 07/10/2008 [employer] Patient Information 2012 [employer]. Contraception Choices Contraception (birth control) is the use of any methods or devices to prevent pregnancy. Below are some methods to help avoid pregnancy. HORMONAL METHODS   Contraceptive implant.  This is a thin, plastic tube containing progesterone hormone. It does not contain estrogen hormone. Your caregiver inserts the tube in the inner part of the upper arm. The tube can remain in place for up to 3 years. After 3 years, the implant must be removed. The implant prevents the ovaries from releasing an egg (ovulation), thickens the cervical mucus which prevents sperm from entering the uterus, and thins the lining of the inside of the uterus.   Progesterone-only injections. These injections are given every 3 months by your caregiver to prevent pregnancy. This synthetic progesterone hormone stops the ovaries from releasing eggs. It also thickens cervical mucus and changes the uterine lining. This makes it harder for sperm to survive in the uterus.   Birth control pills. These pills contain estrogen and progesterone hormone. They work by stopping the egg from forming in the ovary (ovulation). Birth control pills are prescribed by a caregiver.Birth control pills can also be used to treat heavy periods.   Minipill. This type of birth control pill contains only the progesterone hormone. They are taken every day of each month and must be prescribed by your caregiver.   Birth control patch. The patch contains hormones similar to those in birth control pills. It must be changed once a week and is prescribed by a caregiver.   Vaginal ring. The ring contains hormones similar to those in birth control pills. It is left in the vagina for 3 weeks, removed for 1 week, and then a new one is put back in place. The patient must be comfortable inserting and removing the ring from the vagina.A caregiver's prescription is necessary.   Emergency contraception. Emergency contraceptives prevent pregnancy after unprotected sexual intercourse. This pill can be taken right after sex or up to 5 days after unprotected sex. It is most effective the sooner you take the pills after having sexual intercourse. Emergency  contraceptive pills are available without a prescription.   Check with your pharmacist. Do not use emergency contraception as your only form of birth control.  BARRIER METHODS   Female condom. This is a thin sheath (latex or rubber) that is worn over the penis during sexual intercourse. It can be used with spermicide to increase effectiveness.   Female condom. This is a soft, loose-fitting sheath that is put into the vagina before sexual intercourse.   Diaphragm. This is a soft, latex, dome-shaped barrier that must be fitted by a caregiver. It is inserted into the vagina, along with a spermicidal jelly. It is inserted before intercourse. The diaphragm should be left in the vagina for 6 to 8 hours after intercourse.   Cervical cap. This is a round, soft, latex or plastic cup that fits over the cervix and must be fitted by a caregiver. The cap can be left in place for up to 48 hours after intercourse.   Sponge. This is a soft, circular piece of polyurethane foam. The sponge has spermicide in it. It is inserted into the vagina after wetting it and before sexual intercourse.   Spermicides. These are chemicals that kill or block sperm from entering the cervix and uterus. They come in the form of creams, jellies, suppositories, foam, or tablets. They do not require a prescription. They are inserted into the vagina with an applicator before having sexual intercourse. The process must be repeated every time you have sexual intercourse.  INTRAUTERINE CONTRACEPTION  Intrauterine device (IUD). This is a T-shaped device that is put in a woman's uterus during a menstrual period to prevent pregnancy. There are 2 types:   Copper IUD. This type of IUD is wrapped in copper wire and is placed inside the uterus. Copper makes the uterus and fallopian tubes produce a fluid that kills sperm. It can stay in place for 10 years.   Hormone IUD. This type of IUD contains the hormone progestin (synthetic progesterone). The  hormone thickens the cervical mucus and prevents sperm from entering the uterus, and it also thins the uterine lining to prevent implantation of a fertilized egg. The hormone can weaken or kill the sperm that get into the uterus. It can stay in place for 5 years.  PERMANENT METHODS OF CONTRACEPTION  Female tubal ligation. This is when the woman's fallopian tubes are surgically sealed, tied, or blocked to prevent the egg from traveling to the uterus.   Female sterilization. This is when the female has the tubes that carry sperm tied off (vasectomy).This blocks sperm from entering the vagina during sexual intercourse. After the procedure, the man can still ejaculate fluid (semen).  NATURAL PLANNING METHODS  Natural family planning. This is not having sexual intercourse or using a barrier method (condom, diaphragm, cervical cap) on days the woman could become pregnant.   Calendar method. This is keeping track of the length of each menstrual cycle and identifying when you are fertile.   Ovulation method. This is avoiding sexual intercourse during ovulation.   Symptothermal method. This is avoiding sexual intercourse during ovulation, using a thermometer and ovulation symptoms.   Post-ovulation method. This is timing sexual intercourse after you have ovulated.  Regardless of which type or method of contraception you choose, it is important that you use condoms to protect against the transmission of sexually transmitted diseases (STDs). Talk with your caregiver about which form of contraception is most appropriate for you. Document Released: 01/11/2005 Document Revised: 12/31/2010 Document Reviewed: 05/20/2010 [employer] Patient Information 2012 [employer]. Breastfeeding BENEFITS OF BREASTFEEDING For   the baby  The first milk (colostrum) helps the baby's digestive system function better.   There are antibodies from the mother in the milk that help the baby fight off infections.   The baby has a  lower incidence of asthma, allergies, and SIDS (sudden infant death syndrome).   The nutrients in breast milk are better than formulas for the baby and helps the baby's brain grow better.   Babies who breastfeed have less gas, colic, and constipation.  For the mother  Breastfeeding helps develop a very special bond between mother and baby.   It is more convenient, always available at the correct temperature and cheaper than formula feeding.   It burns calories in the mother and helps with losing weight that was gained during pregnancy.   It makes the uterus contract back down to normal size faster and slows bleeding following delivery.   Breastfeeding mothers have a lower risk of developing breast cancer.  NURSE FREQUENTLY  A healthy, full-term baby may breastfeed as often as every hour or space his or her feedings to every 3 hours.   How often to nurse will vary from baby to baby. Watch your baby for signs of hunger, not the clock.   Nurse as often as the baby requests, or when you feel the need to reduce the fullness of your breasts.   Awaken the baby if it has been 3 to 4 hours since the last feeding.   Frequent feeding will help the mother make more milk and will prevent problems like sore nipples and engorgement of the breasts.  BABY'S POSITION AT THE BREAST  Whether lying down or sitting, be sure that the baby's tummy is facing your tummy.   Support the breast with 4 fingers underneath the breast and the thumb above. Make sure your fingers are well away from the nipple and baby's mouth.   Stroke the baby's lips and cheek closest to the breast gently with your finger or nipple.   When the baby's mouth is open wide enough, place all of your nipple and as much of the dark area around the nipple as possible into your baby's mouth.   Pull the baby in close so the tip of the nose and the baby's cheeks touch the breast during the feeding.  FEEDINGS  The length of each feeding  varies from baby to baby and from feeding to feeding.   The baby must suck about 2 to 3 minutes for your milk to get to him or her. This is called a "let down." For this reason, allow the baby to feed on each breast as long as he or she wants. Your baby will end the feeding when he or she has received the right balance of nutrients.   To break the suction, put your finger into the corner of the baby's mouth and slide it between his or her gums before removing your breast from his or her mouth. This will help prevent sore nipples.  REDUCING BREAST ENGORGEMENT  In the first week after your baby is born, you may experience signs of breast engorgement. When breasts are engorged, they feel heavy, warm, full, and may be tender to the touch. You can reduce engorgement if you:   Nurse frequently, every 2 to 3 hours. Mothers who breastfeed early and often have fewer problems with engorgement.   Place light ice packs on your breasts between feedings. This reduces swelling. Wrap the ice packs in a lightweight towel to protect   your skin.   Apply moist hot packs to your breast for 5 to 10 minutes before each feeding. This increases circulation and helps the milk flow.   Gently massage your breast before and during the feeding.   Make sure that the baby empties at least one breast at every feeding before switching sides.   Use a breast pump to empty the breasts if your baby is sleepy or not nursing well. You may also want to pump if you are returning to work or or you feel you are getting engorged.   Avoid bottle feeds, pacifiers or supplemental feedings of water or juice in place of breastfeeding.   Be sure the baby is latched on and positioned properly while breastfeeding.   Prevent fatigue, stress, and anemia.   Wear a supportive bra, avoiding underwire styles.   Eat a balanced diet with enough fluids.  If you follow these suggestions, your engorgement should improve in 24 to 48 hours. If you are  still experiencing difficulty, call your lactation consultant or caregiver. IS MY BABY GETTING ENOUGH MILK? Sometimes, mothers worry about whether their babies are getting enough milk. You can be assured that your baby is getting enough milk if:  The baby is actively sucking and you hear swallowing.   The baby nurses at least 8 to 12 times in a 24 hour time period. Nurse your baby until he or she unlatches or falls asleep at the first breast (at least 10 to 20 minutes), then offer the second side.   The baby is wetting 5 to 6 disposable diapers (6 to 8 cloth diapers) in a 24 hour period by 5 to 6 days of age.   The baby is having at least 2 to 3 stools every 24 hours for the first few months. Breast milk is all the food your baby needs. It is not necessary for your baby to have water or formula. In fact, to help your breasts make more milk, it is best not to give your baby supplemental feedings during the early weeks.   The stool should be soft and yellow.   The baby should gain 4 to 7 ounces per week after he is 4 days old.  TAKE CARE OF YOURSELF Take care of your breasts by:  Bathing or showering daily.   Avoiding the use of soaps on your nipples.   Start feedings on your left breast at one feeding and on your right breast at the next feeding.   You will notice an increase in your milk supply 2 to 5 days after delivery. You may feel some discomfort from engorgement, which makes your breasts very firm and often tender. Engorgement "peaks" out within 24 to 48 hours. In the meantime, apply warm moist towels to your breasts for 5 to 10 minutes before feeding. Gentle massage and expression of some milk before feeding will soften your breasts, making it easier for your baby to latch on. Wear a well fitting nursing bra and air dry your nipples for 10 to 15 minutes after each feeding.   Only use cotton bra pads.   Only use pure lanolin on your nipples after nursing. You do not need to wash it  off before nursing.  Take care of yourself by:   Eating well-balanced meals and nutritious snacks.   Drinking milk, fruit juice, and water to satisfy your thirst (about 8 glasses a day).   Getting plenty of rest.   Increasing calcium in your diet (1200 mg   a day).   Avoiding foods that you notice affect the baby in a bad way.  SEEK MEDICAL CARE IF:   You have any questions or difficulty with breastfeeding.   You need help.   You have a hard, red, sore area on your breast, accompanied by a fever of 100.5 F (38.1 C) or more.   Your baby is too sleepy to eat well or is having trouble sleeping.   Your baby is wetting less than 6 diapers per day, by 58 days of age.   Your baby's skin or white part of his or her eyes is more yellow than it was in the hospital.   You feel depressed.  Document Released: 01/11/2005 Document Revised: 12/31/2010 Document Reviewed: 08/26/2008 [facility] Patient Information 2012 [place], [place]. Sterilization, Women Sterilization is a surgical procedure. This surgery permanently prevents pregnancy in women. This can be done by tying (with or without cutting) the fallopian tubes or burning the tubes closed (tubal ligation). Tubal ligation blocks the tubes and prevents the egg from being fertilized by the sperm. Sterilization can be done by removing the ovaries that produce the egg (castration) as well. Sterilization is considered safe with very rare complications. It does not affect menstrual periods, sexual desire, or performance.  Since sterilization is considered permanent, you should not do it until you are sure you do not want to have more children. You and your partner should fully agree to have the procedure. Your decision to have the procedure should not be made when you are in a stressful situation. This can include a loss of a pregnancy, illness or death of a spouse, or divorce. There are other means of preventing unwanted pregnancies that can be used until  you are completely sure you want to be sterilized. Sterilization does not protect against sexually transmitted disease. Women who had a sterilization procedure and want it reversed must know that it requires an expensive and major operation. The reversal may not be successful and has a high rate of tubal (ectopic) pregnancy that can be dangerous and require surgery. There are several ways to perform a tubal sterlization:  Laparoscopy. The abdomen is filled with a gas to see the pelvic organs. Then, a tube with a light attached is inserted into the abdomen through 2 small incisions. The fallopian tubes are blocked with a ring, clip or electrocautery to burn closed the tubes. Then, the gas is released and the small incisions are closed.   Hysteroscopy. A tube with a light is inserted in the vagina, through the cervix and then into the uterus. A spring-like instrument is inserted into the opening of the fallopian tubes. The spring causes scaring and blocks the tubes. Other forms of contraception should be used for three months at which time an X-ray is done to be sure the tubes are blocked.   Minilaparotomy. This is done right after giving birth. A small incision is made under the belly button and the tubes are exposed. The tubes can then be burned, tied and/or cut.   Tubal ligation can be done during a Cesarean section.   Castration is a surgical procedure that removes both ovaries.  Tubal sterilization should be discussed with your caregiver to answer any concerns you or your partner might have. This meeting will help to decide for sure if the operation is safe for you and which procedure is the best one for you. You can change your mind and cancel the surgery at any time. HOME CARE INSTRUCTIONS  Follow your caregivers instructions regarding diet, rest, work, social and sexual activities and follow up appointments.   Shoulder pain is common following a laparoscopy. The pain may be relieved by lying  down flat.   Only take over-the-counter or prescription medicines for pain, discomfort or fever as directed by your caregiver.   You may use lozenges for throat discomfort.   Keep the incisions covered to prevent infection.  SEEK IMMEDIATE MEDICAL CARE IF:   You develop a temperature of 102 F (38.9 C), or as your caregiver suggests.   You become dizzy or faint.   You start to feel sick to your stomach (nausea) or throw up (vomit).   You develop abdominal pain not relieved with over-the-counter medications.   You have redness and puffiness (swelling) of the cut (incision).   You see pus draining from the incision.   You miss a menstrual period.  Document Released: 06/30/2007 Document Revised: 12/31/2010 Document Reviewed: 06/30/2007 [facility] Patient Information 2012 [place], [place].

## 2011-04-28 ENCOUNTER — Ambulatory Visit (INDEPENDENT_AMBULATORY_CARE_PROVIDER_SITE_OTHER): Payer: Medicaid Other | Admitting: Obstetrics & Gynecology

## 2011-04-28 VITALS — BP 110/77 | Wt 213.0 lb

## 2011-04-28 DIAGNOSIS — Z3480 Encounter for supervision of other normal pregnancy, unspecified trimester: Secondary | ICD-10-CM

## 2011-04-28 DIAGNOSIS — O09529 Supervision of elderly multigravida, unspecified trimester: Secondary | ICD-10-CM

## 2011-04-28 LAB — CBC
HCT: 35.7 % — ABNORMAL LOW (ref 36.0–46.0)
Hemoglobin: 11.8 g/dL — ABNORMAL LOW (ref 12.0–15.0)
MCH: 29.7 pg (ref 26.0–34.0)
MCHC: 33.1 g/dL (ref 30.0–36.0)
MCV: 89.9 fL (ref 78.0–100.0)

## 2011-04-28 NOTE — Progress Notes (Signed)
Will sign papers for BTL today. 1 hr GTT and third trimester labs today, will follow up results.  No other complaints or concerns.  Fetal movement and labor precautions reviewed.

## 2011-04-28 NOTE — Patient Instructions (Addendum)
Sterilization, Women Sterilization is a surgical procedure. This surgery permanently prevents pregnancy in women. This can be done by tying (with or without cutting) the fallopian tubes or burning the tubes closed (tubal ligation). Tubal ligation blocks the tubes and prevents the egg from being fertilized by the sperm. Sterilization can be done by removing the ovaries that produce the egg (castration) as well. Sterilization is considered safe with very rare complications. It does not affect menstrual periods, sexual desire, or performance.  Since sterilization is considered permanent, you should not do it until you are sure you do not want to have more children. You and your partner should fully agree to have the procedure. Your decision to have the procedure should not be made when you are in a stressful situation. This can include a loss of a pregnancy, illness or death of a spouse, or divorce. There are other means of preventing unwanted pregnancies that can be used until you are completely sure you want to be sterilized. Sterilization does not protect against sexually transmitted disease. Women who had a sterilization procedure and want it reversed must know that it requires an expensive and major operation. The reversal may not be successful and has a high rate of tubal (ectopic) pregnancy that can be dangerous and require surgery. There are several ways to perform a tubal sterlization:  Laparoscopy. The abdomen is filled with a gas to see the pelvic organs. Then, a tube with a light attached is inserted into the abdomen through 2 small incisions. The fallopian tubes are blocked with a ring, clip or electrocautery to burn closed the tubes. Then, the gas is released and the small incisions are closed.   Hysteroscopy. A tube with a light is inserted in the vagina, through the cervix and then into the uterus. A spring-like instrument is inserted into the opening of the fallopian tubes. The spring causes  scaring and blocks the tubes. Other forms of contraception should be used for three months at which time an X-ray is done to be sure the tubes are blocked.   Minilaparotomy. This is done right after giving birth. A small incision is made under the belly button and the tubes are exposed. The tubes can then be burned, tied and/or cut.   Tubal ligation can be done during a Cesarean section.   Castration is a surgical procedure that removes both ovaries.  Tubal sterilization should be discussed with your caregiver to answer any concerns you or your partner might have. This meeting will help to decide for sure if the operation is safe for you and which procedure is the best one for you. You can change your mind and cancel the surgery at any time. HOME CARE INSTRUCTIONS   Follow your caregivers instructions regarding diet, rest, work, social and sexual activities and follow up appointments.   Shoulder pain is common following a laparoscopy. The pain may be relieved by lying down flat.   Only take over-the-counter or prescription medicines for pain, discomfort or fever as directed by your caregiver.   You may use lozenges for throat discomfort.   Keep the incisions covered to prevent infection.  SEEK IMMEDIATE MEDICAL CARE IF:   You develop a temperature of 102 F (38.9 C), or as your caregiver suggests.   You become dizzy or faint.   You start to feel sick to your stomach (nausea) or throw up (vomit).   You develop abdominal pain not relieved with over-the-counter medications.   You have redness and puffiness (  swelling) of the cut (incision).   You see pus draining from the incision.   You miss a menstrual period.  Document Released: 06/30/2007 Document Revised: 12/31/2010 Document Reviewed: 06/30/2007 [employer] Patient Information 2012 [employer]. 

## 2011-04-30 ENCOUNTER — Encounter: Payer: Self-pay | Admitting: Obstetrics & Gynecology

## 2011-05-11 ENCOUNTER — Ambulatory Visit (INDEPENDENT_AMBULATORY_CARE_PROVIDER_SITE_OTHER): Payer: Medicaid Other | Admitting: Obstetrics & Gynecology

## 2011-05-11 VITALS — BP 117/77 | Wt 214.0 lb

## 2011-05-11 DIAGNOSIS — O09529 Supervision of elderly multigravida, unspecified trimester: Secondary | ICD-10-CM

## 2011-05-11 DIAGNOSIS — Z3480 Encounter for supervision of other normal pregnancy, unspecified trimester: Secondary | ICD-10-CM

## 2011-05-11 NOTE — Progress Notes (Signed)
1 hr GTT 121.  No other complaints or concerns.  Fetal movement and labor precautions reviewed.

## 2011-05-11 NOTE — Patient Instructions (Signed)
Return to clinic for any obstetric concerns or go to [facility]U for evaluation  

## 2011-05-25 ENCOUNTER — Ambulatory Visit (INDEPENDENT_AMBULATORY_CARE_PROVIDER_SITE_OTHER): Payer: Medicaid Other | Admitting: Obstetrics and Gynecology

## 2011-05-25 ENCOUNTER — Encounter: Payer: Self-pay | Admitting: Obstetrics and Gynecology

## 2011-05-25 VITALS — BP 117/86 | Wt 213.0 lb

## 2011-05-25 DIAGNOSIS — O09529 Supervision of elderly multigravida, unspecified trimester: Secondary | ICD-10-CM

## 2011-05-25 DIAGNOSIS — Z3480 Encounter for supervision of other normal pregnancy, unspecified trimester: Secondary | ICD-10-CM

## 2011-05-25 NOTE — Progress Notes (Signed)
Patient doing well without complaints. FM/PTL precautions reviewed 

## 2011-05-25 NOTE — Progress Notes (Signed)
Routine prenatal visit.  Doing well had stomach virus last week.

## 2011-06-08 ENCOUNTER — Ambulatory Visit (INDEPENDENT_AMBULATORY_CARE_PROVIDER_SITE_OTHER): Payer: Medicaid Other | Admitting: Family Medicine

## 2011-06-08 DIAGNOSIS — Z3480 Encounter for supervision of other normal pregnancy, unspecified trimester: Secondary | ICD-10-CM

## 2011-06-08 NOTE — Patient Instructions (Signed)
Pregnancy - Third Trimester The third trimester of pregnancy (the last 3 months) is a period of the most rapid growth for you and your baby. The baby approaches a length of 20 inches and a weight of 6 to 10 pounds. The baby is adding on fat and getting ready for life outside your body. While inside, babies have periods of sleeping and waking, suck their thumbs, and hiccups. You can often feel small contractions of the uterus. This is false labor. It is also called Braxton-Hicks contractions. This is like a practice for labor. The usual problems in this stage of pregnancy include more difficulty breathing, swelling of the hands and feet from water retention, and having to urinate more often because of the uterus and baby pressing on your bladder.  PRENATAL EXAMS  Blood work may continue to be done during prenatal exams. These tests are done to check on your health and the probable health of your baby. Blood work is used to follow your blood levels (hemoglobin). Anemia (low hemoglobin) is common during pregnancy. Iron and vitamins are given to help prevent this. You may also continue to be checked for diabetes. Some of the past blood tests may be done again.   The size of the uterus is measured during each visit. This makes sure your baby is growing properly according to your pregnancy dates.   Your blood pressure is checked every prenatal visit. This is to make sure you are not getting toxemia.   Your urine is checked every prenatal visit for infection, diabetes and protein.   Your weight is checked at each visit. This is done to make sure gains are happening at the suggested rate and that you and your baby are growing normally.   Sometimes, an ultrasound is performed to confirm the position and the proper growth and development of the baby. This is a test done that bounces harmless sound waves off the baby so your caregiver can more accurately determine due dates.   Discuss the type of pain  medication and anesthesia you will have during your labor and delivery.   Discuss the possibility and anesthesia if a Cesarean Section might be necessary.   Inform your caregiver if there is any mental or physical violence at home.  Sometimes, a specialized non-stress test, contraction stress test and biophysical profile are done to make sure the baby is not having a problem. Checking the amniotic fluid surrounding the baby is called an amniocentesis. The amniotic fluid is removed by sticking a needle into the belly (abdomen). This is sometimes done near the end of pregnancy if an early delivery is required. In this case, it is done to help make sure the baby's lungs are mature enough for the baby to live outside of the womb. If the lungs are not mature and it is unsafe to deliver the baby, an injection of cortisone medication is given to the mother 1 to 2 days before the delivery. This helps the baby's lungs mature and makes it safer to deliver the baby. CHANGES OCCURING IN THE THIRD TRIMESTER OF PREGNANCY Your body goes through many changes during pregnancy. They vary from person to person. Talk to your caregiver about changes you notice and are concerned about.  During the last trimester, you have probably had an increase in your appetite. It is normal to have cravings for certain foods. This varies from person to person and pregnancy to pregnancy.   You may begin to get stretch marks on your hips,   abdomen, and breasts. These are normal changes in the body during pregnancy. There are no exercises or medications to take which prevent this change.   Constipation may be treated with a stool softener or adding bulk to your diet. Drinking lots of fluids, fiber in vegetables, fruits, and whole grains are helpful.   Exercising is also helpful. If you have been very active up until your pregnancy, most of these activities can be continued during your pregnancy. If you have been less active, it is helpful  to start an exercise program such as walking. Consult your caregiver before starting exercise programs.   Avoid all smoking, alcohol, un-prescribed drugs, herbs and "street drugs" during your pregnancy. These chemicals affect the formation and growth of the baby. Avoid chemicals throughout the pregnancy to ensure the delivery of a healthy infant.   Backache, varicose veins and hemorrhoids may develop or get worse.   You will tire more easily in the third trimester, which is normal.   The baby's movements may be stronger and more often.   You may become short of breath easily.   Your belly button may stick out.   A yellow discharge may leak from your breasts called colostrum.   You may have a bloody mucus discharge. This usually occurs a few days to a week before labor begins.  HOME CARE INSTRUCTIONS   Keep your caregiver's appointments. Follow your caregiver's instructions regarding medication use, exercise, and diet.   During pregnancy, you are providing food for you and your baby. Continue to eat regular, well-balanced meals. Choose foods such as meat, fish, milk and other low fat dairy products, vegetables, fruits, and whole-grain breads and cereals. Your caregiver will tell you of the ideal weight gain.   A physical sexual relationship may be continued throughout pregnancy if there are no other problems such as early (premature) leaking of amniotic fluid from the membranes, vaginal bleeding, or belly (abdominal) pain.   Exercise regularly if there are no restrictions. Check with your caregiver if you are unsure of the safety of your exercises. Greater weight gain will occur in the last 2 trimesters of pregnancy. Exercising helps:   Control your weight.   Get you in shape for labor and delivery.   You lose weight after you deliver.   Rest a lot with legs elevated, or as needed for leg cramps or low back pain.   Wear a good support or jogging bra for breast tenderness during  pregnancy. This may help if worn during sleep. Pads or tissues may be used in the bra if you are leaking colostrum.   Do not use hot tubs, steam rooms, or saunas.   Wear your seat belt when driving. This protects you and your baby if you are in an accident.   Avoid raw meat, cat litter boxes and soil used by cats. These carry germs that can cause birth defects in the baby.   It is easier to loose urine during pregnancy. Tightening up and strengthening the pelvic muscles will help with this problem. You can practice stopping your urination while you are going to the bathroom. These are the same muscles you need to strengthen. It is also the muscles you would use if you were trying to stop from passing gas. You can practice tightening these muscles up 10 times a set and repeating this about 3 times per day. Once you know what muscles to tighten up, do not perform these exercises during urination. It is more likely   to cause an infection by backing up the urine.   Ask for help if you have financial, counseling or nutritional needs during pregnancy. Your caregiver will be able to offer counseling for these needs as well as refer you for other special needs.   Make a list of emergency phone numbers and have them available.   Plan on getting help from family or friends when you go home from the hospital.   Make a trial run to the hospital.   Take prenatal classes with the father to understand, practice and ask questions about the labor and delivery.   Prepare the baby's room/nursery.   Do not travel out of the city unless it is absolutely necessary and with the advice of your caregiver.   Wear only low or no heal shoes to have better balance and prevent falling.  MEDICATIONS AND DRUG USE IN PREGNANCY  Take prenatal vitamins as directed. The vitamin should contain 1 milligram of folic acid. Keep all vitamins out of reach of children. Only a couple vitamins or tablets containing iron may be fatal  to a baby or young child when ingested.   Avoid use of all medications, including herbs, over-the-counter medications, not prescribed or suggested by your caregiver. Only take over-the-counter or prescription medicines for pain, discomfort, or fever as directed by your caregiver. Do not use aspirin, ibuprofen (Motrin, Advil, Nuprin) or naproxen (Aleve) unless OK'd by your caregiver.   Let your caregiver also know about herbs you may be using.   Alcohol is related to a number of birth defects. This includes fetal alcohol syndrome. All alcohol, in any form, should be avoided completely. Smoking will cause low birth rate and premature babies.   Street/illegal drugs are very harmful to the baby. They are absolutely forbidden. A baby born to an addicted mother will be addicted at birth. The baby will go through the same withdrawal an adult does.  SEEK MEDICAL CARE IF: You have any concerns or worries during your pregnancy. It is better to call with your questions if you feel they cannot wait, rather than worry about them. DECISIONS ABOUT CIRCUMCISION You may or may not know the sex of your baby. If you know your baby is a boy, it may be time to think about circumcision. Circumcision is the removal of the foreskin of the penis. This is the skin that covers the sensitive end of the penis. There is no proven medical need for this. Often this decision is made on what is popular at the time or based upon religious beliefs and social issues. You can discuss these issues with your caregiver or pediatrician. SEEK IMMEDIATE MEDICAL CARE IF:   An unexplained oral temperature above 102 F (38.9 C) develops, or as your caregiver suggests.   You have leaking of fluid from the vagina (birth canal). If leaking membranes are suspected, take your temperature and tell your caregiver of this when you call.   There is vaginal spotting, bleeding or passing clots. Tell your caregiver of the amount and how many pads are  used.   You develop a bad smelling vaginal discharge with a change in the color from clear to white.   You develop vomiting that lasts more than 24 hours.   You develop chills or fever.   You develop shortness of breath.   You develop burning on urination.   You loose more than 2 pounds of weight or gain more than 2 pounds of weight or as suggested by your   caregiver.   You notice sudden swelling of your face, hands, and feet or legs.   You develop belly (abdominal) pain. Round ligament discomfort is a common non-cancerous (benign) cause of abdominal pain in pregnancy. Your caregiver still must evaluate you.   You develop a severe headache that does not go away.   You develop visual problems, blurred or double vision.   If you have not felt your baby move for more than 1 hour. If you think the baby is not moving as much as usual, eat something with sugar in it and lie down on your left side for an hour. The baby should move at least 4 to 5 times per hour. Call right away if your baby moves less than that.   You fall, are in a car accident or any kind of trauma.   There is mental or physical violence at home.  Document Released: 01/05/2001 Document Revised: 12/31/2010 Document Reviewed: 07/10/2008 [employer] Patient Information 2012 [employer]. Breastfeeding BENEFITS OF BREASTFEEDING For the baby  The first milk (colostrum) helps the baby's digestive system function better.   There are antibodies from the mother in the milk that help the baby fight off infections.   The baby has a lower incidence of asthma, allergies, and SIDS (sudden infant death syndrome).   The nutrients in breast milk are better than formulas for the baby and helps the baby's brain grow better.   Babies who breastfeed have less gas, colic, and constipation.  For the mother  Breastfeeding helps develop a very special bond between mother and baby.   It is more convenient, always available at the  correct temperature and cheaper than formula feeding.   It burns calories in the mother and helps with losing weight that was gained during pregnancy.   It makes the uterus contract back down to normal size faster and slows bleeding following delivery.   Breastfeeding mothers have a lower risk of developing breast cancer.  NURSE FREQUENTLY  A healthy, full-term baby may breastfeed as often as every hour or space his or her feedings to every 3 hours.   How often to nurse will vary from baby to baby. Watch your baby for signs of hunger, not the clock.   Nurse as often as the baby requests, or when you feel the need to reduce the fullness of your breasts.   Awaken the baby if it has been 3 to 4 hours since the last feeding.   Frequent feeding will help the mother make more milk and will prevent problems like sore nipples and engorgement of the breasts.  BABY'S POSITION AT THE BREAST  Whether lying down or sitting, be sure that the baby's tummy is facing your tummy.   Support the breast with 4 fingers underneath the breast and the thumb above. Make sure your fingers are well away from the nipple and baby's mouth.   Stroke the baby's lips and cheek closest to the breast gently with your finger or nipple.   When the baby's mouth is open wide enough, place all of your nipple and as much of the dark area around the nipple as possible into your baby's mouth.   Pull the baby in close so the tip of the nose and the baby's cheeks touch the breast during the feeding.  FEEDINGS  The length of each feeding varies from baby to baby and from feeding to feeding.   The baby must suck about 2 to 3 minutes for your milk   to get to him or her. This is called a "let down." For this reason, allow the baby to feed on each breast as long as he or she wants. Your baby will end the feeding when he or she has received the right balance of nutrients.   To break the suction, put your finger into the corner of the  baby's mouth and slide it between his or her gums before removing your breast from his or her mouth. This will help prevent sore nipples.  REDUCING BREAST ENGORGEMENT  In the first week after your baby is born, you may experience signs of breast engorgement. When breasts are engorged, they feel heavy, warm, full, and may be tender to the touch. You can reduce engorgement if you:   Nurse frequently, every 2 to 3 hours. Mothers who breastfeed early and often have fewer problems with engorgement.   Place light ice packs on your breasts between feedings. This reduces swelling. Wrap the ice packs in a lightweight towel to protect your skin.   Apply moist hot packs to your breast for 5 to 10 minutes before each feeding. This increases circulation and helps the milk flow.   Gently massage your breast before and during the feeding.   Make sure that the baby empties at least one breast at every feeding before switching sides.   Use a breast pump to empty the breasts if your baby is sleepy or not nursing well. You may also want to pump if you are returning to work or or you feel you are getting engorged.   Avoid bottle feeds, pacifiers or supplemental feedings of water or juice in place of breastfeeding.   Be sure the baby is latched on and positioned properly while breastfeeding.   Prevent fatigue, stress, and anemia.   Wear a supportive bra, avoiding underwire styles.   Eat a balanced diet with enough fluids.  If you follow these suggestions, your engorgement should improve in 24 to 48 hours. If you are still experiencing difficulty, call your lactation consultant or caregiver. IS MY BABY GETTING ENOUGH MILK? Sometimes, mothers worry about whether their babies are getting enough milk. You can be assured that your baby is getting enough milk if:  The baby is actively sucking and you hear swallowing.   The baby nurses at least 8 to 12 times in a 24 hour time period. Nurse your baby until he or  she unlatches or falls asleep at the first breast (at least 10 to 20 minutes), then offer the second side.   The baby is wetting 5 to 6 disposable diapers (6 to 8 cloth diapers) in a 24 hour period by 5 to 6 days of age.   The baby is having at least 2 to 3 stools every 24 hours for the first few months. Breast milk is all the food your baby needs. It is not necessary for your baby to have water or formula. In fact, to help your breasts make more milk, it is best not to give your baby supplemental feedings during the early weeks.   The stool should be soft and yellow.   The baby should gain 4 to 7 ounces per week after he is 4 days old.  TAKE CARE OF YOURSELF Take care of your breasts by:  Bathing or showering daily.   Avoiding the use of soaps on your nipples.   Start feedings on your left breast at one feeding and on your right breast at the next feeding.     You will notice an increase in your milk supply 2 to 5 days after delivery. You may feel some discomfort from engorgement, which makes your breasts very firm and often tender. Engorgement "peaks" out within 24 to 48 hours. In the meantime, apply warm moist towels to your breasts for 5 to 10 minutes before feeding. Gentle massage and expression of some milk before feeding will soften your breasts, making it easier for your baby to latch on. Wear a well fitting nursing bra and air dry your nipples for 10 to 15 minutes after each feeding.   Only use cotton bra pads.   Only use pure lanolin on your nipples after nursing. You do not need to wash it off before nursing.  Take care of yourself by:   Eating well-balanced meals and nutritious snacks.   Drinking milk, fruit juice, and water to satisfy your thirst (about 8 glasses a day).   Getting plenty of rest.   Increasing calcium in your diet (1200 mg a day).   Avoiding foods that you notice affect the baby in a bad way.  SEEK MEDICAL CARE IF:   You have any questions or difficulty  with breastfeeding.   You need help.   You have a hard, red, sore area on your breast, accompanied by a fever of 100.5 F (38.1 C) or more.   Your baby is too sleepy to eat well or is having trouble sleeping.   Your baby is wetting less than 6 diapers per day, by 5 days of age.   Your baby's skin or white part of his or her eyes is more yellow than it was in the hospital.   You feel depressed.  Document Released: 01/11/2005 Document Revised: 12/31/2010 Document Reviewed: 08/26/2008 [employer] Patient Information 2012 [employer]. 

## 2011-06-08 NOTE — Progress Notes (Signed)
Pt. Doing well, no complaints

## 2011-06-22 ENCOUNTER — Ambulatory Visit (INDEPENDENT_AMBULATORY_CARE_PROVIDER_SITE_OTHER): Payer: Medicaid Other | Admitting: Obstetrics & Gynecology

## 2011-06-22 ENCOUNTER — Encounter: Payer: Self-pay | Admitting: Obstetrics & Gynecology

## 2011-06-22 VITALS — BP 103/73 | Wt 217.0 lb

## 2011-06-22 DIAGNOSIS — Z3480 Encounter for supervision of other normal pregnancy, unspecified trimester: Secondary | ICD-10-CM

## 2011-06-22 DIAGNOSIS — O09529 Supervision of elderly multigravida, unspecified trimester: Secondary | ICD-10-CM

## 2011-06-22 NOTE — Progress Notes (Signed)
Routine visit. Good FM. No problems. Labor precautions reviewed. Cervical cultures at [place].

## 2011-06-29 ENCOUNTER — Encounter: Payer: Self-pay | Admitting: Family Medicine

## 2011-06-29 ENCOUNTER — Ambulatory Visit (INDEPENDENT_AMBULATORY_CARE_PROVIDER_SITE_OTHER): Payer: Medicaid Other | Admitting: Family Medicine

## 2011-06-29 VITALS — BP 101/75 | Wt 213.0 lb

## 2011-06-29 DIAGNOSIS — Z3480 Encounter for supervision of other normal pregnancy, unspecified trimester: Secondary | ICD-10-CM

## 2011-06-29 DIAGNOSIS — R809 Proteinuria, unspecified: Secondary | ICD-10-CM

## 2011-06-29 NOTE — Progress Notes (Signed)
Doing well Cultures today 

## 2011-06-29 NOTE — Patient Instructions (Addendum)
Breastfeeding BENEFITS OF BREASTFEEDING For the baby  The first milk (colostrum) helps the baby's digestive system function better.   There are antibodies from the mother in the milk that help the baby fight off infections.   The baby has a lower incidence of asthma, allergies, and SIDS (sudden infant death syndrome).   The nutrients in breast milk are better than formulas for the baby and helps the baby's brain grow better.   Babies who breastfeed have less gas, colic, and constipation.  For the mother  Breastfeeding helps develop a very special bond between mother and baby.   It is more convenient, always available at the correct temperature and cheaper than formula feeding.   It burns calories in the mother and helps with losing weight that was gained during pregnancy.   It makes the uterus contract back down to normal size faster and slows bleeding following delivery.   Breastfeeding mothers have a lower risk of developing breast cancer.  NURSE FREQUENTLY  A healthy, full-term baby may breastfeed as often as every hour or space his or her feedings to every 3 hours.   How often to nurse will vary from baby to baby. Watch your baby for signs of hunger, not the clock.   Nurse as often as the baby requests, or when you feel the need to reduce the fullness of your breasts.   Awaken the baby if it has been 3 to 4 hours since the last feeding.   Frequent feeding will help the mother make more milk and will prevent problems like sore nipples and engorgement of the breasts.  BABY'S POSITION AT THE BREAST  Whether lying down or sitting, be sure that the baby's tummy is facing your tummy.   Support the breast with 4 fingers underneath the breast and the thumb above. Make sure your fingers are well away from the nipple and baby's mouth.   Stroke the baby's lips and cheek closest to the breast gently with your finger or nipple.   When the baby's mouth is open wide enough, place  all of your nipple and as much of the dark area around the nipple as possible into your baby's mouth.   Pull the baby in close so the tip of the nose and the baby's cheeks touch the breast during the feeding.  FEEDINGS  The length of each feeding varies from baby to baby and from feeding to feeding.   The baby must suck about 2 to 3 minutes for your milk to get to him or her. This is called a "let down." For this reason, allow the baby to feed on each breast as long as he or she wants. Your baby will end the feeding when he or she has received the right balance of nutrients.   To break the suction, put your finger into the corner of the baby's mouth and slide it between his or her gums before removing your breast from his or her mouth. This will help prevent sore nipples.  REDUCING BREAST ENGORGEMENT  In the first week after your baby is born, you may experience signs of breast engorgement. When breasts are engorged, they feel heavy, warm, full, and may be tender to the touch. You can reduce engorgement if you:   Nurse frequently, every 2 to 3 hours. Mothers who breastfeed early and often have fewer problems with engorgement.   Place light ice packs on your breasts between feedings. This reduces swelling. Wrap the ice packs in a   lightweight towel to protect your skin.   Apply moist hot packs to your breast for 5 to 10 minutes before each feeding. This increases circulation and helps the milk flow.   Gently massage your breast before and during the feeding.   Make sure that the baby empties at least one breast at every feeding before switching sides.   Use a breast pump to empty the breasts if your baby is sleepy or not nursing well. You may also want to pump if you are returning to work or or you feel you are getting engorged.   Avoid bottle feeds, pacifiers or supplemental feedings of water or juice in place of breastfeeding.   Be sure the baby is latched on and positioned properly while  breastfeeding.   Prevent fatigue, stress, and anemia.   Wear a supportive bra, avoiding underwire styles.   Eat a balanced diet with enough fluids.  If you follow these suggestions, your engorgement should improve in 24 to 48 hours. If you are still experiencing difficulty, call your lactation consultant or caregiver. IS MY BABY GETTING ENOUGH MILK? Sometimes, mothers worry about whether their babies are getting enough milk. You can be assured that your baby is getting enough milk if:  The baby is actively sucking and you hear swallowing.   The baby nurses at least 8 to 12 times in a 24 hour time period. Nurse your baby until he or she unlatches or falls asleep at the first breast (at least 10 to 20 minutes), then offer the second side.   The baby is wetting 5 to 6 disposable diapers (6 to 8 cloth diapers) in a 24 hour period by 5 to 6 days of age.   The baby is having at least 2 to 3 stools every 24 hours for the first few months. Breast milk is all the food your baby needs. It is not necessary for your baby to have water or formula. In fact, to help your breasts make more milk, it is best not to give your baby supplemental feedings during the early weeks.   The stool should be soft and yellow.   The baby should gain 4 to 7 ounces per week after he is 4 days old.  TAKE CARE OF YOURSELF Take care of your breasts by:  Bathing or showering daily.   Avoiding the use of soaps on your nipples.   Start feedings on your left breast at one feeding and on your right breast at the next feeding.   You will notice an increase in your milk supply 2 to 5 days after delivery. You may feel some discomfort from engorgement, which makes your breasts very firm and often tender. Engorgement "peaks" out within 24 to 48 hours. In the meantime, apply warm moist towels to your breasts for 5 to 10 minutes before feeding. Gentle massage and expression of some milk before feeding will soften your breasts, making  it easier for your baby to latch on. Wear a well fitting nursing bra and air dry your nipples for 10 to 15 minutes after each feeding.   Only use cotton bra pads.   Only use pure lanolin on your nipples after nursing. You do not need to wash it off before nursing.  Take care of yourself by:   Eating well-balanced meals and nutritious snacks.   Drinking milk, fruit juice, and water to satisfy your thirst (about 8 glasses a day).   Getting plenty of rest.   Increasing calcium in   your diet (1200 mg a day).   Avoiding foods that you notice affect the baby in a bad way.  SEEK MEDICAL CARE IF:   You have any questions or difficulty with breastfeeding.   You need help.   You have a hard, red, sore area on your breast, accompanied by a fever of 100.5 F (38.1 C) or more.   Your baby is too sleepy to eat well or is having trouble sleeping.   Your baby is wetting less than 6 diapers per day, by 5 days of age.   Your baby's skin or white part of his or her eyes is more yellow than it was in the hospital.   You feel depressed.  Document Released: 01/11/2005 Document Revised: 12/31/2010 Document Reviewed: 08/26/2008 [employer] Patient Information 2012 [employer]. Normal Labor and Delivery Your caregiver must first be sure you are in labor. Signs of labor include:  You may pass what is called "the mucus plug" before labor begins. This is a small amount of blood stained mucus.   Regular uterine contractions.   The time between contractions get closer together.   The discomfort and pain gradually gets more intense.   Pains are mostly located in the back.   Pains get worse when walking.   The cervix (the opening of the uterus becomes thinner (begins to efface) and opens up (dilates).  Once you are in labor and admitted into the hospital or care center, your caregiver will do the following:  A complete physical examination.   Check your vital signs (blood pressure, pulse,  temperature and the fetal heart rate).   Do a vaginal examination (using a sterile glove and lubricant) to determine:   The position (presentation) of the baby (head [vertex] or buttock first).   The level (station) of the baby's head in the birth canal.   The effacement and dilatation of the cervix.   You may have your pubic hair shaved and be given an enema depending on your caregiver and the circumstance.   An electronic monitor is usually placed on your abdomen. The monitor follows the length and intensity of the contractions, as well as the baby's heart rate.   Usually, your caregiver will insert an IV in your arm with a bottle of sugar water. This is done as a precaution so that medications can be given to you quickly during labor or delivery.  NORMAL LABOR AND DELIVERY IS DIVIDED UP INTO 3 STAGES: First Stage This is when regular contractions begin and the cervix begins to efface and dilate. This stage can last from 3 to 15 hours. The end of the first stage is when the cervix is 100% effaced and 10 centimeters dilated. Pain medications may be given by   Injection (morphine, demerol, etc.)   Regional anesthesia (spinal, caudal or epidural, anesthetics given in different locations of the spine). Paracervical pain medication may be given, which is an injection of and anesthetic on each side of the cervix.  A pregnant woman may request to have "Natural Childbirth" which is not to have any medications or anesthesia during her labor and delivery. Second Stage This is when the baby comes down through the birth canal (vagina) and is born. This can take 1 to 4 hours. As the baby's head comes down through the birth canal, you may feel like you are going to have a bowel movement. You will get the urge to bear down and push until the baby is delivered. As the baby's   head is being delivered, the caregiver will decide if an episiotomy (a cut in the perineum and vagina area) is needed to prevent  tearing of the tissue in this area. The episiotomy is sewn up after the delivery of the baby and placenta. Sometimes a mask with nitrous oxide is given for the mother to breath during the delivery of the baby to help if there is too much pain. The end of Stage 2 is when the baby is fully delivered. Then when the umbilical cord stops pulsating it is clamped and cut. Third Stage The third stage begins after the baby is completely delivered and ends after the placenta (afterbirth) is delivered. This usually takes 5 to 30 minutes. After the placenta is delivered, a medication is given either by intravenous or injection to help contract the uterus and prevent bleeding. The third stage is not painful and pain medication is usually not necessary. If an episiotomy was done, it is repaired at this time. After the delivery, the mother is watched and monitored closely for 1 to 2 hours to make sure there is no postpartum bleeding (hemorrhage). If there is a lot of bleeding, medication is given to contract the uterus and stop the bleeding. Document Released: 10/21/2007 Document Revised: 12/31/2010 Document Reviewed: 10/21/2007 [employer] Patient Information 2012 [employer].  

## 2011-06-30 LAB — GC/CHLAMYDIA PROBE AMP, GENITAL: GC Probe Amp, Genital: NEGATIVE

## 2011-07-01 LAB — CULTURE, OB URINE
Colony Count: NO GROWTH
Organism ID, Bacteria: NO GROWTH

## 2011-07-05 ENCOUNTER — Encounter (HOSPITAL_COMMUNITY): Payer: Self-pay | Admitting: *Deleted

## 2011-07-05 ENCOUNTER — Inpatient Hospital Stay (HOSPITAL_COMMUNITY)
Admission: AD | Admit: 2011-07-05 | Discharge: 2011-07-05 | Disposition: A | Discharge status: Hospice | Payer: Medicaid Other | Source: Ambulatory Visit | Attending: Obstetrics and Gynecology | Admitting: Obstetrics and Gynecology

## 2011-07-05 DIAGNOSIS — E860 Dehydration: Secondary | ICD-10-CM | POA: Insufficient documentation

## 2011-07-05 DIAGNOSIS — R109 Unspecified abdominal pain: Secondary | ICD-10-CM | POA: Insufficient documentation

## 2011-07-05 DIAGNOSIS — N949 Unspecified condition associated with female genital organs and menstrual cycle: Secondary | ICD-10-CM

## 2011-07-05 DIAGNOSIS — O99891 Other specified diseases and conditions complicating pregnancy: Secondary | ICD-10-CM | POA: Insufficient documentation

## 2011-07-05 LAB — URINE MICROSCOPIC-ADD ON

## 2011-07-05 LAB — URINALYSIS, ROUTINE W REFLEX MICROSCOPIC
Hgb urine dipstick: NEGATIVE
Leukocytes, UA: NEGATIVE
Nitrite: NEGATIVE
Protein, ur: 30 mg/dL — AB
Urobilinogen, UA: 1 mg/dL (ref 0.0–1.0)
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
## 2011-07-05 NOTE — MAU Note (Signed)
Pt G5 P3 at 37.2wks having urinary frequency and lower abd pressure since 1200.  Denies any problems with pregnacy.

## 2011-07-05 NOTE — Discharge Instructions (Signed)

## 2011-07-05 NOTE — MAU Note (Signed)
Pt c/o constantly having to void with a pinchy feeling .  This has been going on since 1230.  Denies any bleeding or flank pain.

## 2011-07-06 ENCOUNTER — Ambulatory Visit (INDEPENDENT_AMBULATORY_CARE_PROVIDER_SITE_OTHER): Payer: Medicaid Other | Admitting: Obstetrics & Gynecology

## 2011-07-06 VITALS — BP 106/76 | Wt 211.0 lb

## 2011-07-06 DIAGNOSIS — O09529 Supervision of elderly multigravida, unspecified trimester: Secondary | ICD-10-CM

## 2011-07-06 DIAGNOSIS — Z3480 Encounter for supervision of other normal pregnancy, unspecified trimester: Secondary | ICD-10-CM

## 2011-07-06 NOTE — Patient Instructions (Signed)
Return to clinic for any obstetric concerns or go to [facility]U for evaluation  

## 2011-07-06 NOTE — Progress Notes (Signed)
No complaints or concerns.  Fetal movement and labor precautions reviewed. 

## 2011-07-07 NOTE — MAU Provider Note (Signed)
Agree with above note.  Artie Takayama 07/07/2011 9:44 AM

## 2011-07-13 ENCOUNTER — Ambulatory Visit (INDEPENDENT_AMBULATORY_CARE_PROVIDER_SITE_OTHER): Payer: Medicaid Other | Admitting: Obstetrics and Gynecology

## 2011-07-13 VITALS — BP 123/68 | Wt 218.0 lb

## 2011-07-13 DIAGNOSIS — Z3480 Encounter for supervision of other normal pregnancy, unspecified trimester: Secondary | ICD-10-CM

## 2011-07-13 DIAGNOSIS — O09529 Supervision of elderly multigravida, unspecified trimester: Secondary | ICD-10-CM

## 2011-07-13 NOTE — Progress Notes (Signed)
Patient doing well without complaints. FM/labor precautions reviewed 

## 2011-07-21 ENCOUNTER — Ambulatory Visit (INDEPENDENT_AMBULATORY_CARE_PROVIDER_SITE_OTHER): Payer: Medicaid Other | Admitting: Obstetrics and Gynecology

## 2011-07-21 VITALS — BP 129/86 | Wt 218.0 lb

## 2011-07-21 DIAGNOSIS — O09529 Supervision of elderly multigravida, unspecified trimester: Secondary | ICD-10-CM

## 2011-07-21 DIAGNOSIS — Z3480 Encounter for supervision of other normal pregnancy, unspecified trimester: Secondary | ICD-10-CM

## 2011-07-21 MED ORDER — PRENATAL MULTIVITAMIN CH: 1.0000 | ORAL_TABLET | Freq: Every day | ORAL | Status: DC

## 2011-07-21 NOTE — Progress Notes (Signed)
Patient doing well without complaints. FM/labor precautions reviewed. Will schedule NST and AFI next week. IOL 7/6

## 2011-07-22 ENCOUNTER — Inpatient Hospital Stay (HOSPITAL_COMMUNITY)
Admission: AD | Admit: 2011-07-22 | Discharge: 2011-07-24 | DRG: 767 | Disposition: A | Discharge status: Home / Self Care | Payer: Medicaid Other | Source: Ambulatory Visit | Attending: Obstetrics and Gynecology | Admitting: Obstetrics and Gynecology

## 2011-07-22 ENCOUNTER — Encounter (HOSPITAL_COMMUNITY): Payer: Self-pay | Admitting: Anesthesiology

## 2011-07-22 ENCOUNTER — Inpatient Hospital Stay (HOSPITAL_COMMUNITY): Payer: Medicaid Other | Admitting: Anesthesiology

## 2011-07-22 ENCOUNTER — Encounter (HOSPITAL_COMMUNITY): Payer: Self-pay

## 2011-07-22 ENCOUNTER — Other Ambulatory Visit: Payer: Self-pay | Admitting: Obstetrics & Gynecology

## 2011-07-22 ENCOUNTER — Encounter (HOSPITAL_COMMUNITY)
Admission: AD | Disposition: A | Discharge status: Home / Self Care | Payer: Self-pay | Source: Ambulatory Visit | Attending: Obstetrics and Gynecology

## 2011-07-22 DIAGNOSIS — Z302 Encounter for sterilization: Secondary | ICD-10-CM

## 2011-07-22 DIAGNOSIS — IMO0001 Reserved for inherently not codable concepts without codable children: Secondary | ICD-10-CM

## 2011-07-22 DIAGNOSIS — Z9851 Tubal ligation status: Secondary | ICD-10-CM

## 2011-07-22 DIAGNOSIS — O09529 Supervision of elderly multigravida, unspecified trimester: Secondary | ICD-10-CM

## 2011-07-22 DIAGNOSIS — O99892 Other specified diseases and conditions complicating childbirth: Principal | ICD-10-CM | POA: Diagnosis present

## 2011-07-22 DIAGNOSIS — Z3009 Encounter for other general counseling and advice on contraception: Secondary | ICD-10-CM | POA: Diagnosis present

## 2011-07-22 DIAGNOSIS — O0940 Supervision of pregnancy with grand multiparity, unspecified trimester: Secondary | ICD-10-CM

## 2011-07-22 DIAGNOSIS — O9989 Other specified diseases and conditions complicating pregnancy, childbirth and the puerperium: Secondary | ICD-10-CM

## 2011-07-22 HISTORY — PX: TUBAL LIGATION: SHX77

## 2011-07-22 LAB — URINALYSIS, ROUTINE W REFLEX MICROSCOPIC
Glucose, UA: NEGATIVE mg/dL
Ketones, ur: 15 mg/dL — AB
Leukocytes, UA: NEGATIVE
pH: 6.5 (ref 5.0–8.0)

## 2011-07-22 LAB — URINE MICROSCOPIC-ADD ON

## 2011-07-22 LAB — CBC
Hemoglobin: 12.6 g/dL (ref 12.0–15.0)
MCH: 30.1 pg (ref 26.0–34.0)
MCHC: 33.8 g/dL (ref 30.0–36.0)
MCV: 89.2 fL (ref 78.0–100.0)
RBC: 4.18 MIL/uL (ref 3.87–5.11)

## 2011-07-22 LAB — SURGICAL PCR SCREEN: MRSA, PCR: POSITIVE — AB

## 2011-07-22 LAB — TYPE AND SCREEN

## 2011-07-22 SURGERY — LIGATION, FALLOPIAN TUBE, POSTPARTUM
Anesthesia: Spinal | Site: Abdomen | Laterality: Bilateral | Wound class: Clean Contaminated

## 2011-07-22 MED ORDER — PROPOFOL 10 MG/ML IV EMUL
INTRAVENOUS | Status: AC
  Filled 2011-07-22: qty 20

## 2011-07-22 MED ORDER — LIDOCAINE-EPINEPHRINE (PF) 2 %-1:200000 IJ SOLN
INTRAMUSCULAR | Status: AC
  Filled 2011-07-22: qty 20

## 2011-07-22 MED ORDER — ONDANSETRON HCL 4 MG/2ML IJ SOLN: 4.0000 mg | Freq: Four times a day (QID) | INTRAMUSCULAR | Status: DC | PRN

## 2011-07-22 MED ORDER — ONDANSETRON HCL 4 MG/2ML IJ SOLN
INTRAMUSCULAR | Status: DC | PRN
  Administered 2011-07-22: 4 mg via INTRAVENOUS

## 2011-07-22 MED ORDER — CITRIC ACID-SODIUM CITRATE 334-500 MG/5ML PO SOLN: 30.0000 mL | ORAL | Status: DC | PRN

## 2011-07-22 MED ORDER — SODIUM BICARBONATE 8.4 % IV SOLN
INTRAVENOUS | Status: AC
  Filled 2011-07-22: qty 50

## 2011-07-22 MED ORDER — MUPIROCIN 2 % EX OINT
TOPICAL_OINTMENT | Freq: Two times a day (BID) | CUTANEOUS | Status: DC
  Administered 2011-07-22: 1 via NASAL
  Administered 2011-07-22 – 2011-07-24 (×3): via NASAL
  Filled 2011-07-22: qty 22

## 2011-07-22 MED ORDER — MIDAZOLAM HCL 2 MG/2ML IJ SOLN: 0.5000 mg | Freq: Once | INTRAMUSCULAR | Status: DC | PRN

## 2011-07-22 MED ORDER — OXYTOCIN 40 UNITS IN LACTATED RINGERS INFUSION - SIMPLE MED
62.5000 mL/h | Freq: Once | INTRAVENOUS | Status: DC
  Filled 2011-07-22: qty 1000

## 2011-07-22 MED ORDER — MEPERIDINE HCL 25 MG/ML IJ SOLN: 6.2500 mg | INTRAMUSCULAR | Status: DC | PRN

## 2011-07-22 MED ORDER — LACTATED RINGERS IV SOLN: 500.0000 mL | INTRAVENOUS | Status: DC | PRN

## 2011-07-22 MED ORDER — DIBUCAINE 1 % RE OINT: 1.0000 "application " | TOPICAL_OINTMENT | RECTAL | Status: DC | PRN

## 2011-07-22 MED ORDER — METOCLOPRAMIDE HCL 10 MG PO TABS
10.0000 mg | ORAL_TABLET | Freq: Once | ORAL | Status: AC
  Administered 2011-07-22: 10 mg via ORAL
  Filled 2011-07-22: qty 1

## 2011-07-22 MED ORDER — SENNOSIDES-DOCUSATE SODIUM 8.6-50 MG PO TABS
2.0000 | ORAL_TABLET | Freq: Every day | ORAL | Status: DC
  Administered 2011-07-22 – 2011-07-23 (×2): 2 via ORAL

## 2011-07-22 MED ORDER — PROMETHAZINE HCL 25 MG/ML IJ SOLN: 6.2500 mg | INTRAMUSCULAR | Status: DC | PRN

## 2011-07-22 MED ORDER — LACTATED RINGERS IV SOLN
INTRAVENOUS | Status: DC
  Administered 2011-07-22: 05:00:00 via INTRAVENOUS

## 2011-07-22 MED ORDER — FENTANYL CITRATE 0.05 MG/ML IJ SOLN: 25.0000 ug | INTRAMUSCULAR | Status: DC | PRN

## 2011-07-22 MED ORDER — ACETAMINOPHEN 325 MG PO TABS: 325.0000 mg | ORAL_TABLET | ORAL | Status: DC | PRN

## 2011-07-22 MED ORDER — BENZOCAINE-MENTHOL 20-0.5 % EX AERO: 1.0000 "application " | INHALATION_SPRAY | CUTANEOUS | Status: DC | PRN

## 2011-07-22 MED ORDER — WITCH HAZEL-GLYCERIN EX PADS: 1.0000 "application " | MEDICATED_PAD | CUTANEOUS | Status: DC | PRN

## 2011-07-22 MED ORDER — TETANUS-DIPHTH-ACELL PERTUSSIS 5-2.5-18.5 LF-MCG/0.5 IM SUSP
0.5000 mL | Freq: Once | INTRAMUSCULAR | Status: AC
  Administered 2011-07-23: 0.5 mL via INTRAMUSCULAR
  Filled 2011-07-22: qty 0.5

## 2011-07-22 MED ORDER — LIDOCAINE HCL (PF) 1 % IJ SOLN
30.0000 mL | INTRAMUSCULAR | Status: DC | PRN
  Filled 2011-07-22: qty 30

## 2011-07-22 MED ORDER — OXYTOCIN 40 UNITS IN LACTATED RINGERS INFUSION - SIMPLE MED: 62.5000 mL/h | INTRAVENOUS | Status: DC | PRN

## 2011-07-22 MED ORDER — OXYTOCIN BOLUS FROM INFUSION
250.0000 mL | Freq: Once | INTRAVENOUS | Status: DC
  Filled 2011-07-22: qty 500

## 2011-07-22 MED ORDER — IBUPROFEN 600 MG PO TABS
600.0000 mg | ORAL_TABLET | Freq: Four times a day (QID) | ORAL | Status: DC
  Administered 2011-07-22 – 2011-07-24 (×7): 600 mg via ORAL
  Filled 2011-07-22 (×7): qty 1

## 2011-07-22 MED ORDER — KETOROLAC TROMETHAMINE 30 MG/ML IJ SOLN
INTRAMUSCULAR | Status: AC
  Filled 2011-07-22: qty 1

## 2011-07-22 MED ORDER — LACTATED RINGERS IV SOLN: INTRAVENOUS | Status: DC

## 2011-07-22 MED ORDER — LANOLIN HYDROUS EX OINT: TOPICAL_OINTMENT | CUTANEOUS | Status: DC | PRN

## 2011-07-22 MED ORDER — DIPHENHYDRAMINE HCL 25 MG PO CAPS: 25.0000 mg | ORAL_CAPSULE | Freq: Four times a day (QID) | ORAL | Status: DC | PRN

## 2011-07-22 MED ORDER — OXYCODONE-ACETAMINOPHEN 5-325 MG PO TABS
1.0000 | ORAL_TABLET | ORAL | Status: DC | PRN
  Administered 2011-07-22: 1 via ORAL
  Filled 2011-07-22: qty 1

## 2011-07-22 MED ORDER — FENTANYL CITRATE 0.05 MG/ML IJ SOLN
100.0000 ug | INTRAMUSCULAR | Status: DC | PRN
  Administered 2011-07-22: 100 ug via INTRAVENOUS
  Filled 2011-07-22: qty 2

## 2011-07-22 MED ORDER — FENTANYL CITRATE 0.05 MG/ML IJ SOLN
INTRAMUSCULAR | Status: AC
  Filled 2011-07-22: qty 2

## 2011-07-22 MED ORDER — ZOLPIDEM TARTRATE 5 MG PO TABS: 5.0000 mg | ORAL_TABLET | Freq: Every evening | ORAL | Status: DC | PRN

## 2011-07-22 MED ORDER — FLEET ENEMA 7-19 GM/118ML RE ENEM: 1.0000 | ENEMA | RECTAL | Status: DC | PRN

## 2011-07-22 MED ORDER — KETOROLAC TROMETHAMINE 60 MG/2ML IM SOLN
INTRAMUSCULAR | Status: DC | PRN
  Administered 2011-07-22: 30 mg via INTRAMUSCULAR

## 2011-07-22 MED ORDER — FAMOTIDINE 20 MG PO TABS
40.0000 mg | ORAL_TABLET | Freq: Once | ORAL | Status: AC
  Administered 2011-07-22: 40 mg via ORAL
  Filled 2011-07-22 (×2): qty 1

## 2011-07-22 MED ORDER — ONDANSETRON HCL 4 MG/2ML IJ SOLN: 4.0000 mg | INTRAMUSCULAR | Status: DC | PRN

## 2011-07-22 MED ORDER — FENTANYL CITRATE 0.05 MG/ML IJ SOLN
INTRAMUSCULAR | Status: DC | PRN
  Administered 2011-07-22: 100 ug via INTRAVENOUS

## 2011-07-22 MED ORDER — BUPIVACAINE HCL (PF) 0.25 % IJ SOLN
INTRAMUSCULAR | Status: AC
  Filled 2011-07-22: qty 30

## 2011-07-22 MED ORDER — BUPIVACAINE HCL 0.75 % IJ SOLN
INTRAMUSCULAR | Status: DC | PRN
  Administered 2011-07-22: 1.5 mL

## 2011-07-22 MED ORDER — ONDANSETRON HCL 4 MG PO TABS: 4.0000 mg | ORAL_TABLET | ORAL | Status: DC | PRN

## 2011-07-22 MED ORDER — ONDANSETRON HCL 4 MG/2ML IJ SOLN
INTRAMUSCULAR | Status: AC
  Filled 2011-07-22: qty 2

## 2011-07-22 MED ORDER — OXYCODONE-ACETAMINOPHEN 5-325 MG PO TABS
1.0000 | ORAL_TABLET | ORAL | Status: DC | PRN
  Administered 2011-07-22 – 2011-07-23 (×2): 1 via ORAL
  Filled 2011-07-22 (×2): qty 1

## 2011-07-22 MED ORDER — IBUPROFEN 600 MG PO TABS
600.0000 mg | ORAL_TABLET | Freq: Four times a day (QID) | ORAL | Status: DC | PRN
  Administered 2011-07-22: 600 mg via ORAL
  Filled 2011-07-22: qty 1

## 2011-07-22 MED ORDER — BUPIVACAINE HCL (PF) 0.25 % IJ SOLN
INTRAMUSCULAR | Status: DC | PRN
  Administered 2011-07-22: 11 mL

## 2011-07-22 MED ORDER — SIMETHICONE 80 MG PO CHEW: 80.0000 mg | CHEWABLE_TABLET | ORAL | Status: DC | PRN

## 2011-07-22 MED ORDER — PRENATAL MULTIVITAMIN CH
1.0000 | ORAL_TABLET | Freq: Every day | ORAL | Status: DC
  Administered 2011-07-23 – 2011-07-24 (×2): 1 via ORAL
  Filled 2011-07-22 (×2): qty 1

## 2011-07-22 MED ORDER — OXYTOCIN 40 UNITS IN LACTATED RINGERS INFUSION - SIMPLE MED
INTRAVENOUS | Status: DC | PRN
  Administered 2011-07-22: 250 mL/h via INTRAVENOUS

## 2011-07-22 MED ORDER — KETOROLAC TROMETHAMINE 30 MG/ML IJ SOLN: 15.0000 mg | Freq: Once | INTRAMUSCULAR | Status: DC | PRN

## 2011-07-22 MED ORDER — SODIUM CHLORIDE 0.9 % IV SOLN
INTRAVENOUS | Status: DC
  Administered 2011-07-22: 16:00:00 via INTRAVENOUS

## 2011-07-22 MED ORDER — ACETAMINOPHEN 325 MG PO TABS: 650.0000 mg | ORAL_TABLET | ORAL | Status: DC | PRN

## 2011-07-22 SURGICAL SUPPLY — 22 items
BLADE SURG 11 STRL SS (BLADE) ×2 IMPLANT
CHLORAPREP W/TINT 26ML (MISCELLANEOUS) ×2 IMPLANT
CLIP FILSHIE TUBAL LIGA STRL (Clip) ×4 IMPLANT
CLOTH BEACON ORANGE TIMEOUT ST (SAFETY) ×2 IMPLANT
DRSG COVADERM PLUS 2X2 (GAUZE/BANDAGES/DRESSINGS) ×2 IMPLANT
GLOVE BIO SURGEON STRL SZ 6.5 (GLOVE) ×2 IMPLANT
GLOVE BIOGEL PI IND STRL 7.0 (GLOVE) ×2 IMPLANT
GLOVE BIOGEL PI INDICATOR 7.0 (GLOVE) ×2
GLOVE INDICATOR 7.0 STRL GRN (GLOVE) ×4 IMPLANT
GLOVE INDICATOR 7.5 STRL GRN (GLOVE) ×2 IMPLANT
GOWN PREVENTION PLUS LG XLONG (DISPOSABLE) ×4 IMPLANT
NEEDLE HYPO 25X1 1.5 SAFETY (NEEDLE) ×2 IMPLANT
NS IRRIG 1000ML POUR BTL (IV SOLUTION) ×2 IMPLANT
PACK ABDOMINAL MINOR (CUSTOM PROCEDURE TRAY) ×2 IMPLANT
SPONGE LAP 4X18 X RAY DECT (DISPOSABLE) IMPLANT
SUT VIC AB 0 CT1 27 (SUTURE) ×1
SUT VIC AB 0 CT1 27XBRD ANBCTR (SUTURE) ×1 IMPLANT
SUT VICRYL 4-0 PS2 18IN ABS (SUTURE) ×2 IMPLANT
SYR CONTROL 10ML LL (SYRINGE) ×2 IMPLANT
TOWEL OR 17X24 6PK STRL BLUE (TOWEL DISPOSABLE) ×4 IMPLANT
TRAY FOLEY BAG SILVER LF 14FR (CATHETERS) ×2 IMPLANT
WATER STERILE IRR 1000ML POUR (IV SOLUTION) ×2 IMPLANT

## 2011-07-22 NOTE — MAU Note (Signed)
Patient is here for labor eval. States ctx since midnight q2-36mins. Denies any vaginal bleeding or lof. Reports god fetal movement. Patient states that she was 2cm on Wednesday at [place] office.

## 2011-07-22 NOTE — Anesthesia Preprocedure Evaluation (Signed)
Anesthesia Evaluation  Patient identified by MRN, date of birth, ID band Patient awake    Reviewed: Allergy & Precautions, H&P , NPO status , Patient's Chart, lab work & pertinent test results  Airway Mallampati: III      Dental No notable dental hx.    Pulmonary neg pulmonary ROS,  breath sounds clear to auscultation  Pulmonary exam normal       Cardiovascular Exercise Tolerance: Good negative cardio ROS  Rhythm:regular Rate:Normal     Neuro/Psych  Headaches, negative neurological ROS  negative psych ROS   GI/Hepatic negative GI ROS, Neg liver ROS,   Endo/Other  negative endocrine ROSMorbid obesity  Renal/GU negative Renal ROS  negative genitourinary   Musculoskeletal   Abdominal Normal abdominal exam  (+)   Peds  Hematology negative hematology ROS (+)   Anesthesia Other Findings  IBS (irritable bowel syndrome)     Headache   Reproductive/Obstetrics                           Anesthesia Physical Anesthesia Plan  ASA: III  Anesthesia Plan: Spinal   Post-op Pain Management:    Induction:   Airway Management Planned:   Additional Equipment:   Intra-op Plan:   Post-operative Plan:   Informed Consent: I have reviewed the patients History and Physical, chart, labs and discussed the procedure including the risks, benefits and alternatives for the proposed anesthesia with the patient or authorized representative who has indicated his/her understanding and acceptance.     Plan Discussed with: Anesthesiologist, CRNA and Surgeon  Anesthesia Plan Comments:         Anesthesia Quick Evaluation

## 2011-07-22 NOTE — Anesthesia Postprocedure Evaluation (Signed)
  Anesthesia Post-op Note  Patient: Christine Chen  Procedure(s) Performed: Procedure(s) (LRB): POST PARTUM TUBAL LIGATION (Bilateral)  Patient Location: Mother/Baby  Anesthesia Type: Spinal  Level of Consciousness: awake, alert  and oriented  Airway and Oxygen Therapy: Patient Spontanous Breathing  Post-op Pain: none  Post-op Assessment: Post-op Vital signs reviewed and Patient's Cardiovascular Status Stable  Post-op Vital Signs: Reviewed and stable  Complications: No apparent anesthesia complications

## 2011-07-22 NOTE — Addendum Note (Signed)
Addendum  created 07/22/11 1802 by Shanon Payor, CRNA   Modules edited:Charges VN, Notes Section

## 2011-07-22 NOTE — Progress Notes (Signed)
Patient ID: Christine Chen, female   DOB: Nov 23, 1975, 36 y.o.   MRN: [id number] Delivery Note At 6:37 AM a viable and healthy female was delivered via Vaginal, Spontaneous Delivery (Presentation: Left Occiput Anterior).  APGAR:9 9, ; weight .   Placenta status:intact, schultz , .  Cord: 3 vessels with the following complications: None.  Cord pH: not required  Anesthesia: None  Episiotomy:  Lacerations:  Suture Repair: none Est. Blood Loss (mL):   Mom to postpartum.  Baby to nursery-stable. Mother desires postpartum tubal ligation today, will schedule for Dr Debroah Loop at noon today. CBC at 10 am  Keiron Iodice V 07/22/2011, 6:52 AM

## 2011-07-22 NOTE — Anesthesia Postprocedure Evaluation (Signed)
Anesthesia Post Note  Patient: Christine Chen  Procedure(s) Performed: Procedure(s) (LRB): POST PARTUM TUBAL LIGATION (Bilateral)  Anesthesia type: Spinal  Patient location: PACU  Post pain: Pain level controlled  Post assessment: Post-op Vital signs reviewed  Last Vitals:  Filed Vitals:   07/22/11 1345  BP: 105/63  Pulse: 68  Temp: 37.2 C  Resp: 18    Post vital signs: Reviewed  Level of consciousness: awake  Complications: No apparent anesthesia complications

## 2011-07-22 NOTE — Progress Notes (Signed)
  Subjective: Moderate contractions  Objective: BP 136/80  Pulse 87  Temp 98.3 F (36.8 C) (Oral)  Resp 18  Ht 5\' 2"  (1.575 m)  Wt 98.884 kg (218 lb)  BMI 39.87 kg/m2  LMP 10/08/2010      FHT:  FHR: 130 bpm, variability: moderate,  accelerations:  Present,  decelerations:  Absent UC:   regular, every 3-4 minutes SVE:   Dilation: 4.5 Effacement (%): 90 Station: -1 Exam by:: Dr. Adrian Blackwater  Labs: Lab Results  Component Value Date   WBC 11.9* 07/22/2011   HGB 12.6 07/22/2011   HCT 37.3 07/22/2011   MCV 89.2 07/22/2011   PLT 192 07/22/2011    Assessment / Plan: AROM with small clear fluid.  Category 1 tracing.  Continue expectant management.  Christine Chen JEHIEL 07/22/2011, 5:57 AM

## 2011-07-22 NOTE — Transfer of Care (Signed)
Immediate Anesthesia Transfer of Care Note  Patient: Christine Chen  Procedure(s) Performed: Procedure(s) (LRB): POST PARTUM TUBAL LIGATION (Bilateral)  Patient Location: PACU  Anesthesia Type: Spinal  Level of Consciousness: awake, alert  and oriented  Airway & Oxygen Therapy: Patient Spontanous Breathing  Post-op Assessment: Report given to PACU RN and Post -op Vital signs reviewed and stable  Post vital signs: Reviewed and stable  Complications: No apparent anesthesia complications

## 2011-07-22 NOTE — Progress Notes (Signed)
Pt has rash on lower face.

## 2011-07-22 NOTE — Progress Notes (Addendum)
Dr Adrian Blackwater notified of patient, ctx pattern, tracing, bp readings. Plan to recheck cervix in an hour.

## 2011-07-22 NOTE — Anesthesia Procedure Notes (Signed)
Spinal  Patient location during procedure: OR Start time: 07/22/2011 12:23 PM Staffing Anesthesiologist: Brayton Caves R Performed by: anesthesiologist  Preanesthetic Checklist Completed: patient identified, site marked, surgical consent, pre-op evaluation, timeout performed, IV checked, risks and benefits discussed and monitors and equipment checked Spinal Block Patient position: sitting Prep: DuraPrep Patient monitoring: heart rate, cardiac monitor, continuous pulse ox and blood pressure Approach: midline Location: L3-4 Injection technique: single-shot Needle Needle type: Sprotte  Needle gauge: 24 G Needle length: 9 cm Assessment Sensory level: T4 Additional Notes Patient identified.  Risk benefits discussed including failed block, incomplete pain control, headache, nerve damage, paralysis, blood pressure changes, nausea, vomiting, reactions to medication both toxic or allergic, and postpartum back pain.  Patient expressed understanding and wished to proceed.  All questions were answered.  Sterile technique used throughout procedure.  CSF was clear.  No parasthesia or other complications.  Please see nursing notes for vital signs.

## 2011-07-22 NOTE — Progress Notes (Signed)
Patient ID: Christine Chen, female   DOB: July 28, 1975, 36 y.o.   MRN: [id number] S/P SVD, plans permanent sterilization. Medicaid papers signed. The procedure and the risk of failure, ectopic, bleeding, infection and visceral organ damage were discussed and her questions were answered. The procedure is scheduled for 1200.  Mehak Roskelley 07/22/2011 9:48 AM

## 2011-07-22 NOTE — Op Note (Signed)
TAI SYFERT 07/22/2011  PREOPERATIVE DIAGNOSIS:  Multiparity, undesired fertility  POSTOPERATIVE DIAGNOSIS:  Multiparity, undesired fertility  PROCEDURE:  Postpartum Bilateral Tubal Sterilization using Filshie Clips   ANESTHESIA:  Epidural  COMPLICATIONS:  None immediate.  ESTIMATED BLOOD LOSS:  Less than 20 ml.  FLUIDS: 500 ml LR.  URINE OUTPUT:  30 ml of clear urine.  INDICATIONS: 36 y.o. [id number]  with undesired fertility,status post vaginal delivery, desires permanent sterilization. Risks and benefits of procedure discussed with patient including permanence of method, bleeding, infection, injury to surrounding organs and need for additional procedures. Risk failure of 0.5-1% with increased risk of ectopic gestation if pregnancy occurs was also discussed with patient.   FINDINGS:  Normal uterus, tubes, and ovaries.  TECHNIQUE:  The patient was taken to the operating room where spinal anesthesia was induced and level and found to be adequate.  She was then placed in the dorsal supine position and prepped and draped in sterile fashion.  After an adequate timeout was performed, attention was turned to the patient's abdomen where a small transverse skin incision was made under the umbilical fold. The incision was taken down to the layer of fascia using the scalpel, and fascia was incised, and extended bilaterally using Mayo scissors. The peritoneum was entered in a sharp fashion. Attention was then turned to the patient's uterus, and left fallopian tube was identified and followed out to the fimbriated end.  A Filshie clip was placed on the left fallopian tube about 2 cm from the cornual attachment, with care given to incorporate the underlying mesosalpinx.  A similar process was carried out on the rightl side allowing for bilateral tubal sterilization.  Good hemostasis was noted overall.  Local analgesia was drizzled on both operative sites.The instruments were then removed from the  patient's abdomen and the fascial incision was repaired with 0 Vicryl, and the skin was closed with a 4-0 Vicryl subcuticular stitch. The patient tolerated the procedure well.  Sponge, lap, and needle counts were correct times two.  The patient was then taken to the recovery room awake, extubated and in stable condition.  Telisha Zawadzki 07/22/2011 1:03 PM
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
## 2011-07-23 ENCOUNTER — Encounter (HOSPITAL_COMMUNITY): Payer: Self-pay | Admitting: Obstetrics & Gynecology

## 2011-07-23 LAB — CBC
Hemoglobin: 11.9 g/dL — ABNORMAL LOW (ref 12.0–15.0)
MCH: 30 pg (ref 26.0–34.0)
MCHC: 33.3 g/dL (ref 30.0–36.0)
MCV: 89.9 fL (ref 78.0–100.0)

## 2011-07-23 NOTE — Progress Notes (Signed)
I examined pt and agree with documentation above and medical student plan of care. [facility]

## 2011-07-23 NOTE — Progress Notes (Signed)
Patient ID: Christine Chen, female   DOB: 27-Jul-1975, 36 y.o.   MRN: [id number] S: Patient doing well this morning and slept well overnight. Pain controlled: Yes. Lochia: decreased.  Eating/drinking: Yes. Flatus: Yes. BM: No. Voiding: Yes. Ambulating: Yes. Breast feeding well: Yes.   O: Filed Vitals:   07/23/11 0650  BP: 115/75  Pulse: 80  Temp: 98 F (36.7 C)  Resp: 18   Physical Exam: Gen: NAD, doing well CV: RRR Pulm: CTAB Abd: soft, + bowel sounds, FF 1" above umbilicus, fundus firm Ext: 2+ pitting edema B/L LE  Results for orders placed during the hospital encounter of 07/22/11 (from the past 24 hour(s))  SURGICAL PCR SCREEN     Status: Abnormal   Collection Time   07/22/11 11:15 AM      Component Value Range   MRSA, PCR POSITIVE (*) NEGATIVE   Staphylococcus aureus POSITIVE (*) NEGATIVE  CBC     Status: Abnormal   Collection Time   07/23/11 12:50 AM      Component Value Range   WBC 11.2 (*) 4.0 - 10.5 K/uL   RBC 3.97  3.87 - 5.11 MIL/uL   Hemoglobin 11.9 (*) 12.0 - 15.0 g/dL   HCT 40.9 (*) 81.1 - 91.4 %   MCV 89.9  78.0 - 100.0 fL   MCH 30.0  26.0 - 34.0 pg   MCHC 33.3  30.0 - 36.0 g/dL   RDW 78.2  95.6 - 21.3 %   Platelets 187  150 - 400 K/uL    A/P: 36 y.o. year old [id number] PPD# 1 s/p SVD w/o complications -female/ breast/ birth control: BTL performed 07/22/11 -Continue routine post-partum care. 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

## 2011-07-23 NOTE — Progress Notes (Signed)
UR Chart review completed.  

## 2011-07-24 DIAGNOSIS — Z9851 Tubal ligation status: Secondary | ICD-10-CM

## 2011-07-24 MED ORDER — IBUPROFEN 600 MG PO TABS: 600.0000 mg | ORAL_TABLET | Freq: Four times a day (QID) | ORAL | Status: AC

## 2011-07-24 NOTE — Discharge Summary (Signed)
Obstetric Discharge Summary Reason for Admission: onset of labor Prenatal Procedures: ultrasound Intrapartum Procedures: spontaneous vaginal delivery Postpartum Procedures: P.P. tubal ligation Complications-Operative and Postpartum: none Hemoglobin  Date Value Range Status  07/23/2011 11.9* 12.0 - 15.0 g/dL Final     HCT  Date Value Range Status  07/23/2011 35.7* 36.0 - 46.0 % Final    Physical Exam:  General: alert, cooperative and no distress Lochia: appropriate, scant Uterine Fundus: firm, -1 Incision: lap incision at umbilicus with clean, dry dressing DVT Evaluation: No evidence of DVT seen on physical exam. Negative Homan's sign. No cords or calf tenderness. No significant calf/ankle edema.  Discharge Diagnoses: Term Pregnancy-delivered  Discharge Information: Date: 07/24/2011 Activity: pelvic rest Diet: routine Medications: Ibuprofen Condition: stable Instructions: refer to practice specific booklet Discharge to: home Follow-up Information    Please follow up. (Follow up at [facility] office in 4-6 weeks for postpartum visit.)          Newborn Data: Live born female  Birth Weight: 6 lb 11.2 oz (3040 g) APGAR: 9, 10  Home with mother.  LEFTWICH-KIRBY, Sahmir Weatherbee 07/24/2011, 7:40 AM

## 2011-07-24 NOTE — Discharge Instructions (Signed)
Tubal Ligation Care After Please read the instructions outlined below and refer to this sheet in the next few weeks. These discharge instructions give you general information on care after leaving the hospital. Your surgeon may also give you specific instructions. While treatment has been planned according to the most current medical practices available, unavoidable complications sometimes happen. If you have any problems or questions after discharge, please call your surgeon or caregiver. HOME CARE INSTRUCTIONS  Do not drink alcohol, drive a car, use public transportation, or sign important papers for at least 1 day following surgery.   Only take over-the-counter or prescription medicines for pain, discomfort, or fever as directed by your caregiver.   If your surgery was done as a same-day surgery, make sure you have a responsible adult with you for the first 24 hours following your surgery.   You may resume normal diet and activities as directed.   Change dressings as directed.   Do not douche or engage in sexual intercourse until your surgeon has given you permission.  SEEK MEDICAL CARE IF:   You have redness, swelling, or increasing pain in the wound or wound area.   You have pus coming from the wound.   You have a fever.   You have a bad smell coming from a wound or dressing.   Your wound edges separate after sutures or staples have been removed.   You have increasing abdominal pain.   You have excessive vaginal bleeding.   You have increasing pain in your shoulders or chest.   You have dizzy episodes or fainting while standing.   You have persistent nausea or vomiting.  SEEK IMMEDIATE MEDICAL CARE IF:   You have a rash.   You have a hard time breathing.   You feel you are developing any reaction or side effects to medications taken.  MAKE SURE YOU:   Understand these instructions.   Will watch this condition.   Will get help right away if you are not doing well  or get worse.  Document Released: 10/17/2003 Document Revised: 12/31/2010 Document Reviewed: 04/06/2007 [facility] Patient Information 2012 [place], [place].  Postpartum Care After Vaginal Delivery After you deliver your baby, you will stay in the hospital for 24 to 72 hours, unless there were problems with the labor or delivery, or you have medical problems. While you are in the hospital, you will receive help and instructions on how to care for yourself and your baby. Your doctor will order pain medicine, in case you need it. You will have a small amount of bleeding from your vagina and should change your sanitary pad frequently. Wash your hands thoroughly with soap and water for at least 20 seconds after changing pads and using the toilet. Let the nurses know if you begin to pass blood clots or your bleeding increases. Do not flush blood clots down the toilet before having the nurse look at them, to make sure there is no placental tissue with them. If you had an intravenous (IV), it will be removed within 24 hours, if there are no problems. The first time you get out of bed or take a shower, call the nurse to help you because you may get weak, lightheaded, or even faint. If you are breastfeeding, you may feel painful contractions of your uterus for a couple of weeks. This is normal. The contractions help your uterus get back to normal size. If you are not breastfeeding, wear a supportive bra and handle your breasts as  little as possible until your milk has dried up. Hormones should not be given to dry up the breasts, because they can cause blood clots. You will be given your normal diet, unless you have diabetes or other medical problems.  The nurses may put an ice pack on your episiotomy (surgically enlarged opening), if you have one, to reduce the pain and swelling. On rare occasions, you may not be able to urinate and the nurse will need to empty your bladder with a catheter. If you had a postpartum tubal  ligation ("tying tubes," female sterilization), it should not make your stay in the hospital longer. You may have your baby in your room with you as much as you like, unless you or the baby has a problem. Use the bassinet (basket) for the baby when going to and from the nursery. Do not carry the baby. Do not leave the postpartum area. If the mother is Rh negative (lacks a protein on the red blood cells) and the baby is Rh positive, the mother should get a Rho-gam shot to prevent Rh problems with future pregnancies. You may be given written instructions for you and your baby, and necessary medicines, when you are discharged from the hospital. Be sure you understand and follow the instructions as advised. HOME CARE INSTRUCTIONS   Follow instructions and take the medicines given to you.   Only take over-the-counter or prescription medicines for pain, discomfort, or fever as directed by your caregiver.   Do not take aspirin, because it can cause bleeding.   Increase your activities a little bit every day to build up your strength and endurance.   Do not drink alcohol, especially if you are breastfeeding or taking pain medicine.   Take your temperature twice a day and record it.   You may have a small amount of bleeding or spotting for 2 to 4 weeks. This is normal.   Do not use tampons or douche. Use sanitary pads.   Try to have someone stay and help you for a few days when you go home.   Try to rest or take a nap when the baby is sleeping.   If you are breastfeeding, wear a good support bra. If you are not breastfeeding, wear a supportive bra and do not stimulate your nipples.   Eat a healthy, nutritious diet and continue to take your prenatal vitamins.   Do not drive, do any heavy activities, or travel until your caregiver tells you it is okay.   Do not have intercourse until your caregiver gives you permission to do so.   Ask your caregiver when you can begin to exercise and what type  of exercises to do.   Call your caregiver if you think you are having a problem from your delivery.   Call your pediatrician if you are having a problem with the baby.   Schedule your postpartum visit and keep it.  SEEK MEDICAL CARE IF:   You have a temperature of 100 F (37.8 C) or higher.   You have increased vaginal bleeding or are passing clots. Save any clots to show your caregiver.   You have bloody urine or pain when you urinate.   You have a bad smelling vaginal discharge.   You have increasing pain or swelling on your episiotomy.   You develop a severe headache.   You feel depressed.   The episiotomy is separating.   You become dizzy or lightheaded.   You develop a rash.  You have a reaction or problems with your medicine.   You have pain, redness, or swelling at the intravenous site.  SEEK IMMEDIATE MEDICAL CARE IF:   You have chest pain.   You develop shortness of breath.   You pass out.   You develop pain, with or without swelling or redness in your leg.   You develop heavy vaginal bleeding, with or without blood clots.   You develop stomach pain.   You develop a bad smelling vaginal discharge.  MAKE SURE YOU:   Understand these instructions.   Will watch your condition.   Will get help right away if you are not doing well or get worse.  Document Released: 11/08/2006 Document Revised: 12/31/2010 Document Reviewed: 11/20/2008 [facility] Patient Information 2012 [place], [place].

## 2011-07-28 ENCOUNTER — Encounter: Payer: Medicaid Other | Admitting: Obstetrics & Gynecology

## 2011-09-02 ENCOUNTER — Ambulatory Visit (INDEPENDENT_AMBULATORY_CARE_PROVIDER_SITE_OTHER): Payer: Medicaid Other | Admitting: Obstetrics & Gynecology

## 2011-09-02 ENCOUNTER — Encounter: Payer: Self-pay | Admitting: Obstetrics & Gynecology

## 2011-09-02 NOTE — Progress Notes (Signed)
  Subjective:    Patient ID: Margaretmary Bayley, female    DOB: 1975-05-26, 36 y.o.   MRN: [id number]  HPI  Ms. Fenner is 6 weeks post partum, NSVD, and post op (PPS). She has no complaints- she is ambulating, voiding normally, breastfeeding, no sex or period yet.  Review of Systems  Last pap 1/13 normal    Objective:   Physical Exam Normal perineum NSSA, NT, no adnexal masses       Assessment & Plan:  Pp/po- doing well. RTC 1 year for annual

## 2012-11-15 ENCOUNTER — Encounter (HOSPITAL_COMMUNITY): Payer: Self-pay | Admitting: Emergency Medicine

## 2012-11-15 ENCOUNTER — Emergency Department (HOSPITAL_COMMUNITY)
Admission: EM | Admit: 2012-11-15 | Discharge: 2012-11-15 | Disposition: A | Discharge status: Home / Self Care | Payer: Self-pay | Attending: Emergency Medicine | Admitting: Emergency Medicine

## 2012-11-15 DIAGNOSIS — Z792 Long term (current) use of antibiotics: Secondary | ICD-10-CM | POA: Insufficient documentation

## 2012-11-15 DIAGNOSIS — Z87891 Personal history of nicotine dependence: Secondary | ICD-10-CM | POA: Insufficient documentation

## 2012-11-15 DIAGNOSIS — Z8719 Personal history of other diseases of the digestive system: Secondary | ICD-10-CM | POA: Insufficient documentation

## 2012-11-15 DIAGNOSIS — R6883 Chills (without fever): Secondary | ICD-10-CM | POA: Insufficient documentation

## 2012-11-15 DIAGNOSIS — L989 Disorder of the skin and subcutaneous tissue, unspecified: Secondary | ICD-10-CM | POA: Insufficient documentation

## 2012-11-15 MED ORDER — CEPHALEXIN 500 MG PO CAPS: 500.0000 mg | ORAL_CAPSULE | Freq: Four times a day (QID) | ORAL | Status: DC

## 2012-11-15 NOTE — ED Notes (Signed)
Dressed wound with 4x4
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
## 2012-11-15 NOTE — ED Notes (Signed)
Pt c/o a boil to R anterior calf onset 2 weeks ago. Reports initially it looked like a small red mole but now continues to get larger and oozing blood every time she changes the dressing

## 2012-11-16 ENCOUNTER — Other Ambulatory Visit: Payer: Self-pay | Admitting: Dermatology

## 2012-11-16 NOTE — ED Provider Notes (Signed)
Medical screening examination/treatment/procedure(s) were performed by non-physician practitioner and as supervising physician I was immediately available for consultation/collaboration.     Junius Argyle, MD 11/16/12 [phone]

## 2013-04-07 IMAGING — US US OB DETAIL+14 WK
1 series · 12 of 28 positions shown · non-contrast
Comparison: none

[Series 1: us ob detail +14 wk · 12 of 89 slices shown]
[im 4/89]
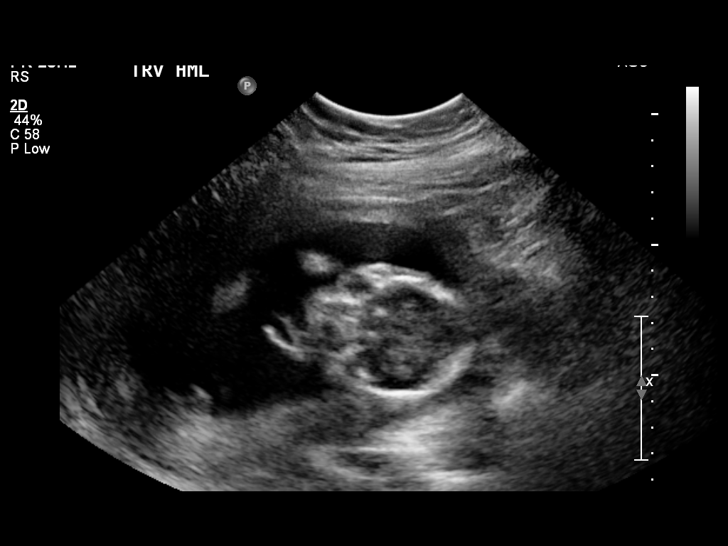
[im 10/89]
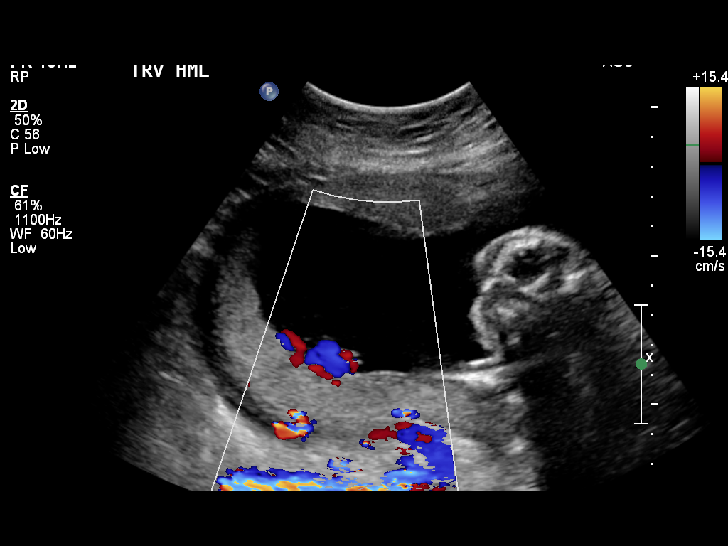
[im 17/89]
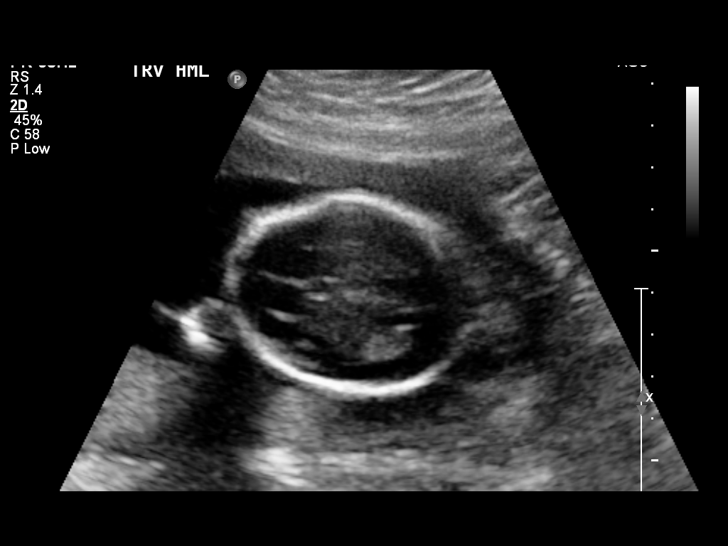
[im 27/89]
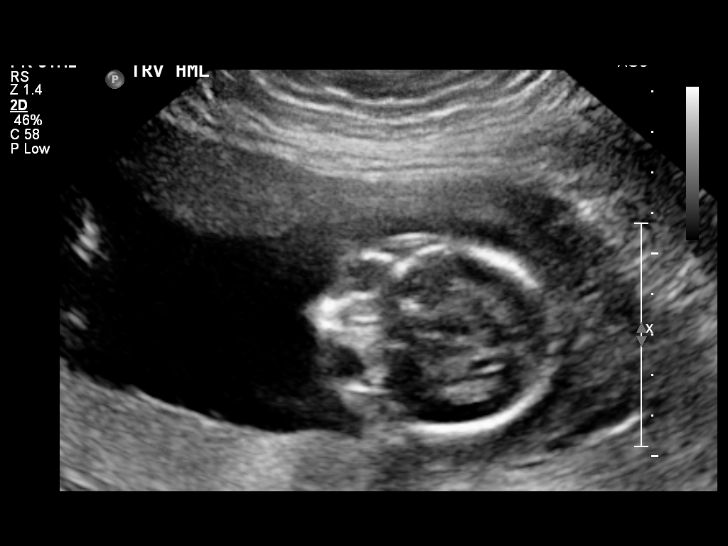
[im 33/89]
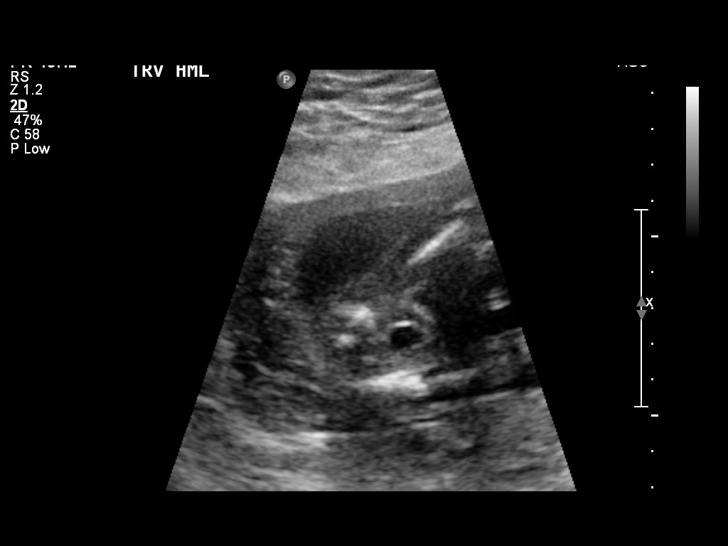
[im 40/89]
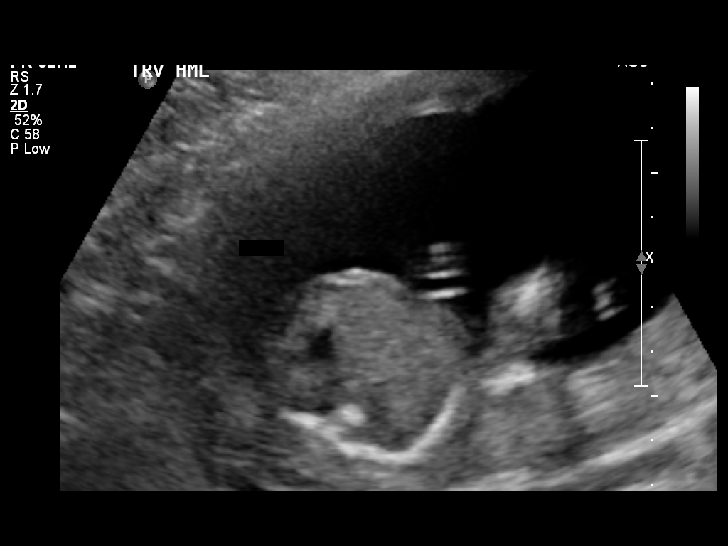
[im 49/89]
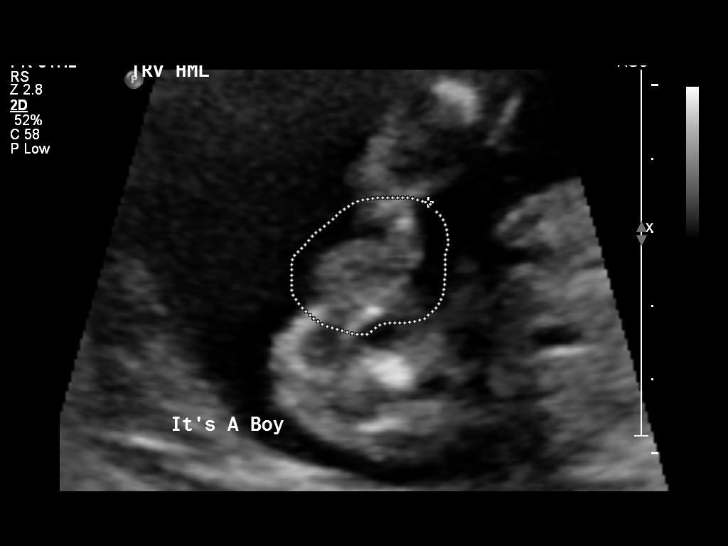
[im 56/89]
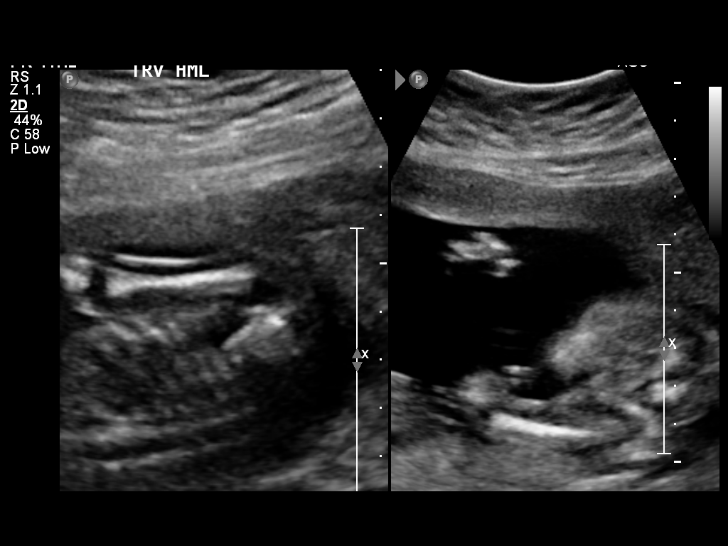
[im 62/89]
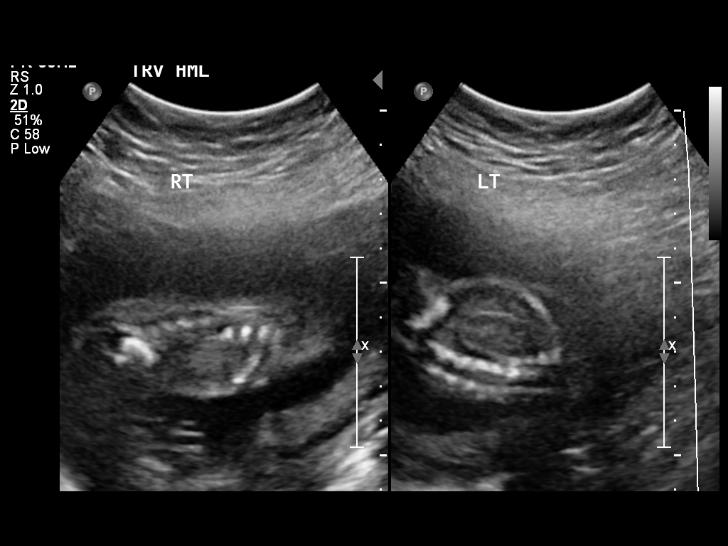
[im 72/89]
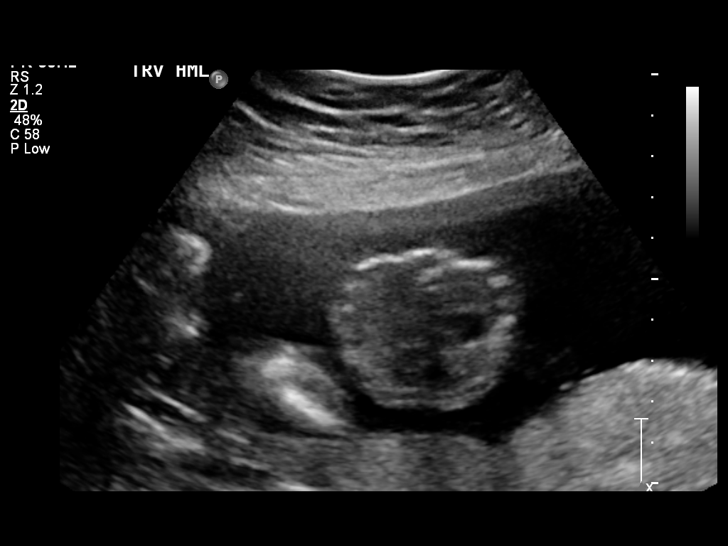
[im 79/89]
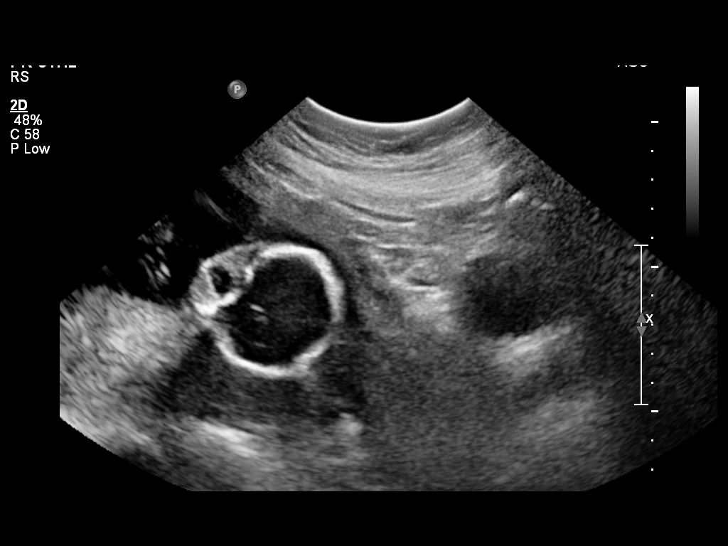
[im 85/89]
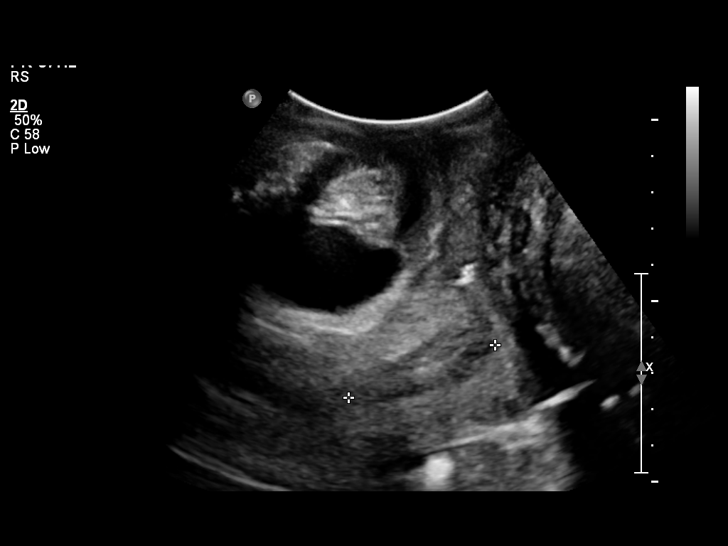

[12 of 28 positions shown; findings below may reference images not displayed]

OBSTETRICS REPORT
                      (Signed Final 03/01/2011 [DATE])

 Order#:         [id number]
Procedures

 US OB DETAIL + 14 WK                                  76811.0
Indications

 Detailed fetal anatomic survey
Fetal Evaluation

 Fetal Heart Rate:  158                         bpm
 Cardiac Activity:  Observed
 Presentation:      Variable
 Placenta:          Posterior, above cervical
                    os
 P. Cord            Visualized
 Insertion:

 Amniotic Fluid
 AFI FV:      Subjectively within normal limits
                                             Larg Pckt:     4.4  cm
Biometry

 BPD:       45  mm    G. Age:   19w 4d                CI:           67   70 - 86
                                                      FL/HC:      17.3   16.1 -

 HC:     176.1  mm    G. Age:   20w 1d       79  %    HC/AC:      1.26   1.09 -

 AC:       140  mm    G. Age:   19w 3d       49  %    FL/BPD:
 FL:      30.4  mm    G. Age:   19w 3d       47  %    FL/AC:      21.7   20 - 24
 HUM:     30.1  mm    G. Age:   20w 0d       67  %
 CER:     20.1  mm    G. Age:   19w 1d       47  %

 Est. FW:     294  gm    0 lb 10 oz      50  %
Gestational Age

 Clinical EDD:  19w 2d                                        EDD:   07/24/11
 U/S Today:     19w 4d                                        EDD:   07/22/11
 Best:          19w 2d    Det. By:   Clinical EDD             EDD:   07/24/11
Anatomy

 Cranium:           Appears normal      Aortic Arch:       Appears normal
 Fetal Cavum:       Appears normal      Ductal Arch:       Appears normal
 Ventricles:        Appears normal      Diaphragm:         Appears normal
 Choroid Plexus:    Appears normal      Stomach:           Appears normal
 Cerebellum:        Appears normal      Abdomen:           Appears normal
 Posterior Fossa:   Not well            Abdominal Wall:    Appears nml
                    visualized                             (cord insert,
                                                           abd wall)
 Nuchal Fold:       Not well            Cord Vessels:      Appears normal
                    visualized                             (3 vessel cord)
 Face:              Lips and orbits     Kidneys:           Appear normal
                    appear normal
 Heart:             Appears normal      Bladder:           Appears normal
                    (4 chamber &
                    axis)
 RVOT:              Not well            Spine:             Not well
                    visualized                             visualized
 LVOT:              Appears normal      Limbs:             Four extremities
                                                           seen

 Other:     5th digit visualized. Left heel visualized. Fetus appears to
            be a male.
Targeted Anatomy

 Fetal Central Nervous System
 Lat. Ventricles:   7.0                 Cisterna Magna:
Cervix Uterus Adnexa

 Cervical Length:   3.8       cm

 Cervix:       Normal appearance by translabial scan.

 Left Ovary:   Not visualized.
 Right Ovary:  Not visualized.
 Adnexa:     No abnormality visualized.
Impression

 Single living intrauterine pregnancy in variable presentation.
 The estimated gestational age is 19w 2d based on Clinical
 EDD. No fetal anomalies are identified. Amniotic fluid volume
 is subjectively normal.

 questions or concerns.

## 2013-05-26 ENCOUNTER — Emergency Department (HOSPITAL_COMMUNITY): Payer: Self-pay

## 2013-05-26 ENCOUNTER — Encounter (HOSPITAL_COMMUNITY): Payer: Self-pay | Admitting: Emergency Medicine

## 2013-05-26 ENCOUNTER — Emergency Department (HOSPITAL_COMMUNITY)
Admission: EM | Admit: 2013-05-26 | Discharge: 2013-05-26 | Disposition: A | Discharge status: Home / Self Care | Payer: Self-pay | Attending: Emergency Medicine | Admitting: Emergency Medicine

## 2013-05-26 DIAGNOSIS — S93401A Sprain of unspecified ligament of right ankle, initial encounter: Secondary | ICD-10-CM

## 2013-05-26 DIAGNOSIS — Z87891 Personal history of nicotine dependence: Secondary | ICD-10-CM | POA: Insufficient documentation

## 2013-05-26 DIAGNOSIS — Y9289 Other specified places as the place of occurrence of the external cause: Secondary | ICD-10-CM | POA: Insufficient documentation

## 2013-05-26 DIAGNOSIS — R296 Repeated falls: Secondary | ICD-10-CM | POA: Insufficient documentation

## 2013-05-26 DIAGNOSIS — Z8719 Personal history of other diseases of the digestive system: Secondary | ICD-10-CM | POA: Insufficient documentation

## 2013-05-26 DIAGNOSIS — Y9301 Activity, walking, marching and hiking: Secondary | ICD-10-CM | POA: Insufficient documentation

## 2013-05-26 DIAGNOSIS — Z9889 Other specified postprocedural states: Secondary | ICD-10-CM | POA: Insufficient documentation

## 2013-05-26 DIAGNOSIS — X500XXA Overexertion from strenuous movement or load, initial encounter: Secondary | ICD-10-CM | POA: Insufficient documentation

## 2013-05-26 DIAGNOSIS — S93409A Sprain of unspecified ligament of unspecified ankle, initial encounter: Secondary | ICD-10-CM | POA: Insufficient documentation

## 2013-05-26 DIAGNOSIS — Z791 Long term (current) use of non-steroidal anti-inflammatories (NSAID): Secondary | ICD-10-CM | POA: Insufficient documentation

## 2013-05-26 NOTE — Discharge Instructions (Signed)
Rest, Ice intermittently (in the first 24-48 hours), Gentle compression with an Ace wrap, and elevate (Limb above the level of the heart)   Take up to 800mg  of ibuprofen (that is usually 4 over the counter pills)  3 times a day for 5 days. Take with food.  Do not hesitate to return to the Emergency Department for any new, worsening or concerning symptoms.   If you do not have a primary care doctor you can establish one at the   [facility]: [place] [id number] [phone]  After you establish care. Let them know you were seen in the emergency room. They must obtain records for further management.     Ankle Sprain An ankle sprain is an injury to the strong, fibrous tissues (ligaments) that hold the bones of your ankle joint together.  CAUSES An ankle sprain is usually caused by a fall or by twisting your ankle. Ankle sprains most commonly occur when you step on the outer edge of your foot, and your ankle turns inward. People who participate in sports are more prone to these types of injuries.  SYMPTOMS   Pain in your ankle. The pain may be present at rest or only when you are trying to stand or walk.  Swelling.  Bruising. Bruising may develop immediately or within 1 to 2 days after your injury.  Difficulty standing or walking, particularly when turning corners or changing directions. DIAGNOSIS  Your caregiver will ask you details about your injury and perform a physical exam of your ankle to determine if you have an ankle sprain. During the physical exam, your caregiver will press on and apply pressure to specific areas of your foot and ankle. Your caregiver will try to move your ankle in certain ways. An X-ray exam may be done to be sure a bone was not broken or a ligament did not separate from one of the bones in your ankle (avulsion fracture).  TREATMENT  Certain types of braces can help stabilize your ankle. Your caregiver can make a  recommendation for this. Your caregiver may recommend the use of medicine for pain. If your sprain is severe, your caregiver may refer you to a surgeon who helps to restore function to parts of your skeletal system (orthopedist) or a physical therapist. [place] ice to your injury for 1 2 days or as directed by your caregiver. Applying ice helps to reduce inflammation and pain.  Put ice in a plastic bag.  Place a towel between your skin and the bag.  Leave the ice on for 15-20 minutes at a time, every 2 hours while you are awake.  Only take over-the-counter or prescription medicines for pain, discomfort, or fever as directed by your caregiver.  Elevate your injured ankle above the level of your heart as much as possible for 2 3 days.  If your caregiver recommends crutches, use them as instructed. Gradually put weight on the affected ankle. Continue to use crutches or a cane until you can walk without feeling pain in your ankle.  If you have a plaster splint, wear the splint as directed by your caregiver. Do not rest it on anything harder than a pillow for the first 24 hours. Do not put weight on it. Do not get it wet. You may take it off to take a shower or bath.  You may have been given an elastic bandage to wear around your ankle to provide support. If  the elastic bandage is too tight (you have numbness or tingling in your foot or your foot becomes cold and blue), adjust the bandage to make it comfortable.  If you have an air splint, you may blow more air into it or let air out to make it more comfortable. You may take your splint off at night and before taking a shower or bath. Wiggle your toes in the splint several times per day to decrease swelling. SEEK MEDICAL CARE IF:   You have rapidly increasing bruising or swelling.  Your toes feel extremely cold or you lose feeling in your foot.  Your pain is not relieved with medicine. SEEK IMMEDIATE MEDICAL CARE  IF:  Your toes are numb or blue.  You have severe pain that is increasing. MAKE SURE YOU:   Understand these instructions.  Will watch your condition.  Will get help right away if you are not doing well or get worse. Document Released: 01/11/2005 Document Revised: 10/06/2011 Document Reviewed: 01/23/2011 [facility] Patient Information 2014 [place], [place].
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
## 2013-05-26 NOTE — ED Notes (Signed)
Pt reports that she got tangled with her [place] few days ago and fell causing injury to top of right foot. Pt presents with bandage to right foot and ankle.

## 2013-05-27 NOTE — ED Provider Notes (Signed)
Medical screening examination/treatment/procedure(s) were performed by non-physician practitioner and as supervising physician I was immediately available for consultation/collaboration.   EKG Interpretation None        Blanchie Dessert, MD 05/27/13 2328

## 2013-11-26 ENCOUNTER — Encounter (HOSPITAL_COMMUNITY): Payer: Self-pay | Admitting: Emergency Medicine

## 2014-03-14 ENCOUNTER — Encounter (HOSPITAL_COMMUNITY): Payer: Self-pay | Admitting: *Deleted

## 2014-03-14 ENCOUNTER — Emergency Department (HOSPITAL_COMMUNITY): Payer: BLUE CROSS/BLUE SHIELD

## 2014-03-14 ENCOUNTER — Emergency Department (HOSPITAL_COMMUNITY)
Admission: EM | Admit: 2014-03-14 | Discharge: 2014-03-14 | Disposition: A | Discharge status: Home / Self Care | Payer: BLUE CROSS/BLUE SHIELD | Attending: Emergency Medicine | Admitting: Emergency Medicine

## 2014-03-14 DIAGNOSIS — D259 Leiomyoma of uterus, unspecified: Secondary | ICD-10-CM | POA: Insufficient documentation

## 2014-03-14 DIAGNOSIS — L732 Hidradenitis suppurativa: Secondary | ICD-10-CM

## 2014-03-14 DIAGNOSIS — Z8719 Personal history of other diseases of the digestive system: Secondary | ICD-10-CM | POA: Diagnosis not present

## 2014-03-14 DIAGNOSIS — L02412 Cutaneous abscess of left axilla: Secondary | ICD-10-CM | POA: Diagnosis present

## 2014-03-14 DIAGNOSIS — R102 Pelvic and perineal pain: Secondary | ICD-10-CM

## 2014-03-14 DIAGNOSIS — Z87891 Personal history of nicotine dependence: Secondary | ICD-10-CM | POA: Insufficient documentation

## 2014-03-14 DIAGNOSIS — Z3202 Encounter for pregnancy test, result negative: Secondary | ICD-10-CM | POA: Insufficient documentation

## 2014-03-14 LAB — COMPREHENSIVE METABOLIC PANEL
ALBUMIN: 3.4 g/dL — AB (ref 3.5–5.2)
ALT: 28 U/L (ref 0–35)
AST: 37 U/L (ref 0–37)
Alkaline Phosphatase: 110 U/L (ref 39–117)
Anion gap: 5 (ref 5–15)
BUN: 9 mg/dL (ref 6–23)
CHLORIDE: 105 mmol/L (ref 96–112)
CO2: 26 mmol/L (ref 19–32)
Calcium: 8.7 mg/dL (ref 8.4–10.5)
Creatinine, Ser: 0.73 mg/dL (ref 0.50–1.10)
GFR calc Af Amer: >90 mL/min (ref >90)
GFR calc non Af Amer: >90 mL/min (ref >90)
Glucose, Bld: 107 mg/dL — ABNORMAL HIGH (ref 70–99)
Potassium: 3.8 mmol/L (ref 3.5–5.1)
Sodium: 136 mmol/L (ref 135–145)
Total Bilirubin: 0.4 mg/dL (ref 0.3–1.2)
Total Protein: 6.8 g/dL (ref 6.0–8.3)

## 2014-03-14 LAB — URINALYSIS, ROUTINE W REFLEX MICROSCOPIC
BILIRUBIN URINE: NEGATIVE
GLUCOSE, UA: NEGATIVE mg/dL
Hgb urine dipstick: NEGATIVE
KETONES UR: NEGATIVE mg/dL
LEUKOCYTES UA: NEGATIVE
Nitrite: NEGATIVE
PROTEIN: NEGATIVE mg/dL
Specific Gravity, Urine: 1.015 (ref 1.005–1.030)
UROBILINOGEN UA: 0.2 mg/dL (ref 0.0–1.0)
pH: 6.5 (ref 5.0–8.0)

## 2014-03-14 LAB — CBC WITH DIFFERENTIAL/PLATELET
Basophils Absolute: 0 10*3/uL (ref 0.0–0.1)
Basophils Relative: 1 % (ref 0–1)
Eosinophils Absolute: 0 10*3/uL (ref 0.0–0.7)
Eosinophils Relative: 0 % (ref 0–5)
HCT: 36.1 % (ref 36.0–46.0)
HEMOGLOBIN: 11.4 g/dL — AB (ref 12.0–15.0)
LYMPHS ABS: 0.6 10*3/uL — AB (ref 0.7–4.0)
LYMPHS PCT: 14 % (ref 12–46)
MCH: 24.8 pg — ABNORMAL LOW (ref 26.0–34.0)
MCHC: 31.6 g/dL (ref 30.0–36.0)
MCV: 78.6 fL (ref 78.0–100.0)
MONO ABS: 0.3 10*3/uL (ref 0.1–1.0)
Monocytes Relative: 7 % (ref 3–12)
NEUTROS ABS: 3.1 10*3/uL (ref 1.7–7.7)
NEUTROS PCT: 78 % — AB (ref 43–77)
Platelets: 212 10*3/uL (ref 150–400)
RBC: 4.59 MIL/uL (ref 3.87–5.11)
RDW: 15.4 % (ref 11.5–15.5)
WBC: 4 10*3/uL (ref 4.0–10.5)

## 2014-03-14 LAB — POC URINE PREG, ED: Preg Test, Ur: NEGATIVE

## 2014-03-14 LAB — WET PREP, GENITAL
Clue Cells Wet Prep HPF POC: NONE SEEN
Trich, Wet Prep: NONE SEEN
Yeast Wet Prep HPF POC: NONE SEEN

## 2014-03-14 MED ORDER — KETOROLAC TROMETHAMINE 30 MG/ML IJ SOLN
30.0000 mg | Freq: Once | INTRAMUSCULAR | Status: AC
  Administered 2014-03-14: 30 mg via INTRAVENOUS
  Filled 2014-03-14: qty 1

## 2014-03-14 MED ORDER — TRAMADOL HCL 50 MG PO TABS
50.0000 mg | ORAL_TABLET | Freq: Four times a day (QID) | ORAL | Status: DC | PRN
Start: 2014-03-14 — End: 2014-04-26

## 2014-03-14 MED ORDER — ONDANSETRON HCL 4 MG PO TABS: 4.0000 mg | ORAL_TABLET | Freq: Four times a day (QID) | ORAL | Status: DC

## 2014-03-14 MED ORDER — DOXYCYCLINE HYCLATE 100 MG PO CAPS: 100.0000 mg | ORAL_CAPSULE | Freq: Two times a day (BID) | ORAL | Status: DC

## 2014-03-14 MED ORDER — ONDANSETRON HCL 4 MG/2ML IJ SOLN
4.0000 mg | Freq: Once | INTRAMUSCULAR | Status: AC
  Administered 2014-03-14: 4 mg via INTRAVENOUS
  Filled 2014-03-14: qty 2

## 2014-03-14 NOTE — ED Notes (Signed)
Pt c/o intermittent lower abdominal pain and vaginal bleeding for several days.  Pt also c/o abscesses to L axilla.
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
## 2014-03-14 NOTE — ED Notes (Signed)
Pt resting, watching tv with visitor at bedside

## 2014-03-14 NOTE — ED Notes (Signed)
Pelvic cart set up at bedside; pt undressing from waist down; visitor at bedside

## 2014-03-14 NOTE — ED Notes (Signed)
Pt undressed, in gown, on continuous pulse oximetry and blood pressure cuff; visitor at bedside

## 2014-03-15 LAB — GC/CHLAMYDIA PROBE AMP (~~LOC~~) NOT AT ARMC
CHLAMYDIA, DNA PROBE: NEGATIVE
NEISSERIA GONORRHEA: NEGATIVE

## 2014-04-26 ENCOUNTER — Ambulatory Visit (INDEPENDENT_AMBULATORY_CARE_PROVIDER_SITE_OTHER): Payer: BLUE CROSS/BLUE SHIELD | Admitting: Family Medicine

## 2014-04-26 ENCOUNTER — Encounter: Payer: Self-pay | Admitting: Family Medicine

## 2014-04-26 ENCOUNTER — Other Ambulatory Visit (HOSPITAL_COMMUNITY)
Admission: RE | Admit: 2014-04-26 | Discharge: 2014-04-26 | Disposition: A | Discharge status: Home / Self Care | Payer: BLUE CROSS/BLUE SHIELD | Source: Ambulatory Visit | Attending: Family Medicine | Admitting: Family Medicine

## 2014-04-26 VITALS — BP 112/88 | HR 79 | Temp 97.6°F | Ht 62.0 in | Wt 210.1 lb

## 2014-04-26 DIAGNOSIS — D251 Intramural leiomyoma of uterus: Secondary | ICD-10-CM

## 2014-04-26 DIAGNOSIS — Z124 Encounter for screening for malignant neoplasm of cervix: Secondary | ICD-10-CM | POA: Diagnosis not present

## 2014-04-26 DIAGNOSIS — N939 Abnormal uterine and vaginal bleeding, unspecified: Secondary | ICD-10-CM | POA: Insufficient documentation

## 2014-04-26 DIAGNOSIS — Z01419 Encounter for gynecological examination (general) (routine) without abnormal findings: Secondary | ICD-10-CM

## 2014-04-26 LAB — POCT PREGNANCY, URINE: Preg Test, Ur: NEGATIVE

## 2014-04-26 NOTE — Progress Notes (Addendum)
   Subjective:    Patient ID: Christine Chen, female    DOB: [birth year], 39 y.o.   MRN: [id number]  HPI Patient seen for irregular vaginal bleeding that started about 6 months ago.  Has been having heavy bleeding with severe cramping that lasts for 7-14 days with 5-7 days between episodes.  No palliating or provoking factors.  Had US showing a 3cm posterior intramural fibroid with and endometrial thickness of 11cm.     Review of Systems  Constitutional: Negative for fever, chills and fatigue.  Respiratory: Negative for chest tightness, shortness of breath and wheezing.   Cardiovascular: Negative for chest pain.  Gastrointestinal: Negative for nausea, vomiting, abdominal pain, diarrhea and constipation.  Genitourinary: Positive for vaginal bleeding. Negative for dysuria, decreased urine volume, vaginal discharge, vaginal pain, menstrual problem and dyspareunia.       Objective:   Physical Exam  Constitutional: She appears well-developed and well-nourished.  Cardiovascular: Normal rate and regular rhythm.   Pulmonary/Chest: Effort normal and breath sounds normal.  Abdominal: Soft. Bowel sounds are normal. She exhibits no distension and no mass. There is no tenderness. There is no rebound and no guarding.  Genitourinary: There is no rash, tenderness or lesion on the right labia. There is no rash, tenderness or lesion on the left labia. Right adnexum displays no mass, no tenderness and no fullness. Left adnexum displays no mass, no tenderness and no fullness. No erythema or tenderness in the vagina. No vaginal discharge found.  Skin: Skin is warm and dry. No rash noted. No erythema. No pallor.  Psychiatric: She has a normal mood and affect. Her behavior is normal. Judgment and thought content normal.      Assessment & Plan:   Problem List Items Addressed This Visit    Abnormal uterine bleeding - Primary   Relevant Orders   Surgical pathology   Intramural leiomyoma of uterus    Other  Visit Diagnoses    Pap smear, low-risk        Relevant Orders    Cytology - PAP      PAP done Endometrial biopsy done. Discussed possible treatments for irregular bleeding if biopsy is normal - medication, injection, IUD, surgical.  Had problems with IUD in the past with constant bleeding.  Would like to stay away from surgical intervention.  Will prescribe megace with negative biopsy.  ENDOMETRIAL BIOPSY     The indications for endometrial biopsy were reviewed.   Risks of the biopsy including cramping, bleeding, infection, uterine perforation, inadequate specimen and need for additional procedures  were discussed. The patient states she understands and agrees to undergo procedure today. Consent was signed. Time out was performed. Urine HCG was negative. A sterile speculum was placed in the patient's vagina and the cervix was prepped with Betadine. A 3 mm pipelle was introduced into the endometrial cavity without difficulty to a depth of 11cm, and a moderate amount of tissue was obtained and sent to pathology. The instruments were removed from the patient's vagina. Minimal bleeding from the cervix was noted. The patient tolerated the procedure well. Routine post-procedure instructions were given to the patient. The patient will follow up to review the results and for further management.

## 2014-04-26 NOTE — Patient Instructions (Signed)
Fibroids Fibroids are lumps (tumors) that can occur any place in a woman's body. These lumps are not cancerous. Fibroids vary in size, weight, and where they grow. HOME CARE  Do not take aspirin.  Write down the number of pads or tampons you use during your period. Tell your doctor. This can help determine the best treatment for you. GET HELP RIGHT AWAY IF:  You have pain in your lower belly (abdomen) that is not helped with medicine.  You have cramps that are not helped with medicine.  You have more bleeding between or during your period.  You feel lightheaded or pass out (faint).  Your lower belly pain gets worse. MAKE SURE YOU:  Understand these instructions.  Will watch your condition.  Will get help right away if you are not doing well or get worse. Document Released: 02/13/2010 Document Revised: 04/05/2011 Document Reviewed: 02/13/2010 Exit[employer] Patient Information 2015 [employer]. This information is not intended to replace advice given to you by your health care provider. Make sure you discuss any questions you have with your health care provider.  

## 2014-04-29 LAB — CYTOLOGY - PAP

## 2014-05-03 ENCOUNTER — Other Ambulatory Visit: Payer: Self-pay | Admitting: Family Medicine

## 2014-05-03 MED ORDER — MEGESTROL ACETATE 40 MG PO TABS: 40.0000 mg | ORAL_TABLET | Freq: Every day | ORAL | Status: DC

## 2014-05-06 ENCOUNTER — Telehealth: Payer: Self-pay | Admitting: General Practice

## 2014-05-06 NOTE — Telephone Encounter (Signed)
Called patient, no answer- left message stating we are trying to reach you with results, nothing urgent but please call us back at the clinics 

## 2014-05-06 NOTE — Telephone Encounter (Signed)
-----   Message from Truett Mainland, DO sent at 05/03/2014  8:17 AM EDT ----- Endometrial biopsy is negative and PAP normal - prescribed megace as talked about in our visit.  Please let pt know.

## 2014-05-07 NOTE — Telephone Encounter (Signed)
Called pt and informed her of test results as stated by Dr. Nehemiah Settle. I also advised that he has sent Rx to the pharmacy for the medication which was discussed at her recent visit and pt was instructed on dosage instructions. She voiced understanding of all information given.

## 2014-10-21 ENCOUNTER — Other Ambulatory Visit: Payer: Self-pay | Admitting: General Surgery

## 2015-03-26 ENCOUNTER — Encounter: Payer: Self-pay | Admitting: *Deleted

## 2016-12-21 DIAGNOSIS — D508 Other iron deficiency anemias: Secondary | ICD-10-CM | POA: Diagnosis not present

## 2016-12-21 DIAGNOSIS — N920 Excessive and frequent menstruation with regular cycle: Secondary | ICD-10-CM | POA: Diagnosis not present

## 2017-03-03 DIAGNOSIS — N920 Excessive and frequent menstruation with regular cycle: Secondary | ICD-10-CM | POA: Diagnosis not present

## 2017-03-03 DIAGNOSIS — D508 Other iron deficiency anemias: Secondary | ICD-10-CM | POA: Diagnosis not present

## 2017-09-04 ENCOUNTER — Other Ambulatory Visit: Payer: Self-pay

## 2017-09-04 ENCOUNTER — Encounter (HOSPITAL_COMMUNITY): Payer: Self-pay | Admitting: *Deleted

## 2017-09-04 ENCOUNTER — Emergency Department (HOSPITAL_COMMUNITY): Payer: BLUE CROSS/BLUE SHIELD

## 2017-09-04 ENCOUNTER — Emergency Department (HOSPITAL_COMMUNITY)
Admission: EM | Admit: 2017-09-04 | Discharge: 2017-09-04 | Disposition: A | Discharge status: Home / Self Care | Payer: BLUE CROSS/BLUE SHIELD | Attending: Emergency Medicine | Admitting: Emergency Medicine

## 2017-09-04 DIAGNOSIS — S5001XA Contusion of right elbow, initial encounter: Secondary | ICD-10-CM | POA: Diagnosis not present

## 2017-09-04 DIAGNOSIS — Y92009 Unspecified place in unspecified non-institutional (private) residence as the place of occurrence of the external cause: Secondary | ICD-10-CM | POA: Insufficient documentation

## 2017-09-04 DIAGNOSIS — S59901A Unspecified injury of right elbow, initial encounter: Secondary | ICD-10-CM | POA: Diagnosis present

## 2017-09-04 DIAGNOSIS — W01198A Fall on same level from slipping, tripping and stumbling with subsequent striking against other object, initial encounter: Secondary | ICD-10-CM | POA: Diagnosis not present

## 2017-09-04 DIAGNOSIS — Y998 Other external cause status: Secondary | ICD-10-CM | POA: Diagnosis not present

## 2017-09-04 DIAGNOSIS — Z79899 Other long term (current) drug therapy: Secondary | ICD-10-CM | POA: Insufficient documentation

## 2017-09-04 DIAGNOSIS — Z87891 Personal history of nicotine dependence: Secondary | ICD-10-CM | POA: Diagnosis not present

## 2017-09-04 DIAGNOSIS — Y9301 Activity, walking, marching and hiking: Secondary | ICD-10-CM | POA: Diagnosis not present

## 2017-09-04 MED ORDER — HYDROCODONE-ACETAMINOPHEN 5-325 MG PO TABS: 2.0000 | ORAL_TABLET | ORAL | 0 refills | Status: DC | PRN

## 2017-09-04 MED ORDER — ACETAMINOPHEN 325 MG PO TABS
650.0000 mg | ORAL_TABLET | Freq: Once | ORAL | Status: AC
  Administered 2017-09-04: 650 mg via ORAL
  Filled 2017-09-04: qty 2

## 2017-09-04 MED ORDER — DICLOFENAC SODIUM 50 MG PO TBEC: 50.0000 mg | DELAYED_RELEASE_TABLET | Freq: Two times a day (BID) | ORAL | 0 refills | Status: DC

## 2017-09-04 NOTE — ED Notes (Signed)
Good radial pulse on the rt.  Ice pack and arm sling applied
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
## 2017-09-04 NOTE — Discharge Instructions (Addendum)
Schedule to see the Orthopaedist for evaluation  

## 2017-09-04 NOTE — ED Triage Notes (Signed)
The pt tripped in her yard earlier today and fell olnto her rt arm.  C/o pain in her elbow area worse with finger movement  lmp  Last week

## 2018-04-12 ENCOUNTER — Ambulatory Visit
Admission: EM | Admit: 2018-04-12 | Discharge: 2018-04-12 | Disposition: A | Discharge status: Home / Self Care | Payer: BLUE CROSS/BLUE SHIELD | Attending: Emergency Medicine | Admitting: Emergency Medicine

## 2018-04-12 ENCOUNTER — Encounter: Payer: Self-pay | Admitting: Emergency Medicine

## 2018-04-12 ENCOUNTER — Other Ambulatory Visit: Payer: Self-pay

## 2018-04-12 DIAGNOSIS — J029 Acute pharyngitis, unspecified: Secondary | ICD-10-CM | POA: Insufficient documentation

## 2018-04-12 DIAGNOSIS — H9202 Otalgia, left ear: Secondary | ICD-10-CM | POA: Insufficient documentation

## 2018-04-12 LAB — POCT RAPID STREP A (OFFICE): Rapid Strep A Screen: NEGATIVE

## 2018-04-12 MED ORDER — HYDROCOD POLST-CPM POLST ER 10-8 MG/5ML PO SUER: 5.0000 mL | Freq: Two times a day (BID) | ORAL | 0 refills | Status: DC | PRN

## 2018-04-12 MED ORDER — BENZONATATE 200 MG PO CAPS: 200.0000 mg | ORAL_CAPSULE | Freq: Three times a day (TID) | ORAL | 0 refills | Status: DC | PRN

## 2018-04-12 MED ORDER — IBUPROFEN 600 MG PO TABS: 600.0000 mg | ORAL_TABLET | Freq: Four times a day (QID) | ORAL | 0 refills | Status: DC | PRN

## 2018-04-12 MED ORDER — DEXAMETHASONE SODIUM PHOSPHATE 10 MG/ML IJ SOLN
10.0000 mg | Freq: Once | INTRAMUSCULAR | Status: AC
  Administered 2018-04-12: 10 mg via INTRAMUSCULAR

## 2018-04-12 NOTE — ED Triage Notes (Signed)
Pt presents to [facility] for assessment of cough, congestion, central chest pain, headaches, nausea (denies diarrhea), sore throat and left ear pain x 1 week.

## 2018-04-12 NOTE — Discharge Instructions (Addendum)
your rapid strep was negative today, so we have sent off a throat culture.  We will contact you and call in the appropriate antibiotics if your culture comes back positive for an infection requiring antibiotic treatment.  Give [place] a working phone number.  1 for the cough during the day, Tussionex for the cough at night.   1 gram of Tylenol and 600 mg ibuprofen together 3-4 times a day as needed for pain.  Make sure you drink plenty of extra fluids.  Some people find salt water gargles and  Traditional Medicinal's "Throat Coat" tea helpful. Take 5 mL of liquid Benadryl and 5 mL of Maalox. Mix it together, and then hold it in your mouth for as long as you can and then swallow. You may do this 4 times a day.    Go to www.goodrx.com to look up your medications. This will give you a list of where you can find your prescriptions at the most affordable prices. Or ask the pharmacist what the cash price is, or if they have any other discount programs available to help make your medication more affordable. This can be less expensive than what you would pay with insurance.

## 2018-04-12 NOTE — ED Notes (Signed)
Patient able to ambulate independently  
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
## 2018-04-17 LAB — CULTURE, GROUP A STREP (THRC)

## 2018-09-22 ENCOUNTER — Emergency Department (HOSPITAL_COMMUNITY): Payer: Self-pay

## 2018-09-22 ENCOUNTER — Encounter (HOSPITAL_COMMUNITY): Payer: Self-pay

## 2018-09-22 ENCOUNTER — Other Ambulatory Visit: Payer: Self-pay

## 2018-09-22 ENCOUNTER — Emergency Department (HOSPITAL_COMMUNITY)
Admission: EM | Admit: 2018-09-22 | Discharge: 2018-09-22 | Disposition: A | Discharge status: Home / Self Care | Payer: Self-pay | Attending: Emergency Medicine | Admitting: Emergency Medicine

## 2018-09-22 DIAGNOSIS — J189 Pneumonia, unspecified organism: Secondary | ICD-10-CM | POA: Insufficient documentation

## 2018-09-22 DIAGNOSIS — Z87891 Personal history of nicotine dependence: Secondary | ICD-10-CM | POA: Insufficient documentation

## 2018-09-22 DIAGNOSIS — Z20828 Contact with and (suspected) exposure to other viral communicable diseases: Secondary | ICD-10-CM | POA: Insufficient documentation

## 2018-09-22 LAB — CBC WITH DIFFERENTIAL/PLATELET
Abs Immature Granulocytes: 0.02 10*3/uL (ref 0.00–0.07)
Basophils Absolute: 0 10*3/uL (ref 0.0–0.1)
Basophils Relative: 1 %
Eosinophils Absolute: 0 10*3/uL (ref 0.0–0.5)
Eosinophils Relative: 1 %
HCT: 30.1 % — ABNORMAL LOW (ref 36.0–46.0)
Hemoglobin: 8.3 g/dL — ABNORMAL LOW (ref 12.0–15.0)
Immature Granulocytes: 0 %
Lymphocytes Relative: 22 %
Lymphs Abs: 1.1 10*3/uL (ref 0.7–4.0)
MCH: 18.3 pg — ABNORMAL LOW (ref 26.0–34.0)
MCHC: 27.6 g/dL — ABNORMAL LOW (ref 30.0–36.0)
MCV: 66.3 fL — ABNORMAL LOW (ref 80.0–100.0)
Monocytes Absolute: 0.6 10*3/uL (ref 0.1–1.0)
Monocytes Relative: 12 %
Neutro Abs: 3.1 10*3/uL (ref 1.7–7.7)
Neutrophils Relative %: 64 %
Platelets: 260 10*3/uL (ref 150–400)
RBC: 4.54 MIL/uL (ref 3.87–5.11)
RDW: 19.9 % — ABNORMAL HIGH (ref 11.5–15.5)
WBC: 4.8 10*3/uL (ref 4.0–10.5)
nRBC: 0 % (ref 0.0–0.2)

## 2018-09-22 LAB — BASIC METABOLIC PANEL
Anion gap: 7 (ref 5–15)
BUN: 9 mg/dL (ref 6–20)
CO2: 23 mmol/L (ref 22–32)
Calcium: 8.5 mg/dL — ABNORMAL LOW (ref 8.9–10.3)
Chloride: 103 mmol/L (ref 98–111)
Creatinine, Ser: 0.61 mg/dL (ref 0.44–1.00)
GFR calc Af Amer: >60 mL/min (ref >60)
GFR calc non Af Amer: >60 mL/min (ref >60)
Glucose, Bld: 121 mg/dL — ABNORMAL HIGH (ref 70–99)
Potassium: 3.3 mmol/L — ABNORMAL LOW (ref 3.5–5.1)
Sodium: 133 mmol/L — ABNORMAL LOW (ref 135–145)

## 2018-09-22 LAB — SARS CORONAVIRUS 2 BY RT PCR (HOSPITAL ORDER, PERFORMED IN ~~LOC~~ HOSPITAL LAB): SARS Coronavirus 2: NEGATIVE

## 2018-09-22 LAB — POC OCCULT BLOOD, ED: Fecal Occult Bld: NEGATIVE

## 2018-09-22 MED ORDER — ACETAMINOPHEN 325 MG PO TABS
650.0000 mg | ORAL_TABLET | Freq: Once | ORAL | Status: AC
Start: 2018-09-22 — End: 2018-09-22
  Administered 2018-09-22: 15:00:00 650 mg via ORAL
  Filled 2018-09-22: qty 2

## 2018-09-22 MED ORDER — AZITHROMYCIN 250 MG PO TABS: ORAL_TABLET | ORAL | 0 refills | Status: DC

## 2018-09-22 MED ORDER — ALBUTEROL SULFATE HFA 108 (90 BASE) MCG/ACT IN AERS
1.0000 | INHALATION_SPRAY | Freq: Once | RESPIRATORY_TRACT | Status: AC
  Administered 2018-09-22: 1 via RESPIRATORY_TRACT
  Filled 2018-09-22: qty 6.7

## 2018-09-22 NOTE — ED Triage Notes (Signed)
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
## 2018-09-22 NOTE — ED Notes (Signed)
Patient verbalizes understanding of discharge instructions. Opportunity for questioning and answers were provided. Pt discharged from ED. 

## 2018-09-22 NOTE — Discharge Instructions (Addendum)
Please read and follow all provided instructions.  You have been diagnosed today with Pneumonia. An antibiotic azithromycin was e-prescribed to your pharmacy. Please take it as prescribed.  -Your COVID test today was negative  -We will need to follow-up with your primary care doctor within 1 week to have your hemoglobin rechecked.  This is done by blood test.  Today your hemoglobin was 8.3.  -We will also need to follow-up within 1 month with your primary care doctor to have a repeat chest x-ray as we discussed.  Tests performed today include: Blood counts and electrolytes Chest x-ray -- shows pneumonia   Take medications as prescribed. Please take all of your antibiotics until finished! You may develop abdominal discomfort or diarrhea from the antibiotic.  You may help offset this with probiotics which you can buy or get in yogurt. Do not eat or take the probiotics until 2 hours after your antibiotic.   You may return to the emergency department if symptoms worsen, become progressive, or become more concerning.   SEEK IMMEDIATE MEDICAL CARE IF:  Your illness becomes worse.  You cannot control your cough with suppressants and are losing sleep.  You begin coughing up blood.  You develop pain which is getting worse or is uncontrolled with medicines.  You have a fever (100.31F).  Any of the symptoms which initially brought you in for treatment are getting worse rather than better.  You develop shortness of breath or chest pain.

## 2019-02-05 ENCOUNTER — Emergency Department (HOSPITAL_COMMUNITY)
Admission: EM | Admit: 2019-02-05 | Discharge: 2019-02-05 | Disposition: A | Discharge status: Home / Self Care | Payer: Self-pay | Attending: Emergency Medicine | Admitting: Emergency Medicine

## 2019-02-05 ENCOUNTER — Emergency Department (HOSPITAL_COMMUNITY): Payer: Self-pay

## 2019-02-05 ENCOUNTER — Encounter (HOSPITAL_COMMUNITY): Payer: Self-pay | Admitting: Emergency Medicine

## 2019-02-05 DIAGNOSIS — Y999 Unspecified external cause status: Secondary | ICD-10-CM | POA: Insufficient documentation

## 2019-02-05 DIAGNOSIS — Y929 Unspecified place or not applicable: Secondary | ICD-10-CM | POA: Insufficient documentation

## 2019-02-05 DIAGNOSIS — W1839XA Other fall on same level, initial encounter: Secondary | ICD-10-CM | POA: Insufficient documentation

## 2019-02-05 DIAGNOSIS — S8992XA Unspecified injury of left lower leg, initial encounter: Secondary | ICD-10-CM | POA: Insufficient documentation

## 2019-02-05 DIAGNOSIS — Z87891 Personal history of nicotine dependence: Secondary | ICD-10-CM | POA: Insufficient documentation

## 2019-02-05 DIAGNOSIS — Y9389 Activity, other specified: Secondary | ICD-10-CM | POA: Insufficient documentation

## 2019-02-05 DIAGNOSIS — Z79899 Other long term (current) drug therapy: Secondary | ICD-10-CM | POA: Insufficient documentation

## 2019-02-05 NOTE — ED Triage Notes (Signed)
pt states shad a fall on christmas eve and since then has had left knee pain mostly with bending or kneeling she gets a burning below her knee.

## 2019-02-05 NOTE — ED Notes (Signed)
Ortho tech paged  

## 2019-02-05 NOTE — Progress Notes (Signed)
Orthopedic Tech Progress Note Patient Details:  Christine Chen 1975-07-03 [id number]  Ortho Devices Type of Ortho Device: Knee Sleeve Ortho Device/Splint Location: left Ortho Device/Splint Interventions: Application   Post Interventions Patient Tolerated: Well Instructions Provided: Care of device   [name] 02/05/2019, 4:49 PM

## 2019-02-05 NOTE — Discharge Instructions (Addendum)
Contact a health care provider if: °You have pain that gets worse. °The cast, brace, or splint does not fit right. °The cast, brace, or splint gets damaged. °Get help right away if: °You cannot use your injured joint to support any of your body weight (cannot bear weight). °You cannot move the injured joint. °You cannot walk more than a few steps without pain or without your knee buckling. °You have significant pain, swelling, or numbness below the cast, brace, or splint. °
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
## 2021-09-03 ENCOUNTER — Other Ambulatory Visit: Payer: Self-pay | Admitting: Family Medicine

## 2021-09-03 DIAGNOSIS — R109 Unspecified abdominal pain: Secondary | ICD-10-CM

## 2021-09-08 ENCOUNTER — Ambulatory Visit: Payer: Commercial Managed Care - PPO | Admitting: Podiatrist

## 2021-09-09 ENCOUNTER — Ambulatory Visit: Payer: Commercial Managed Care - PPO

## 2021-09-09 ENCOUNTER — Ambulatory Visit (INDEPENDENT_AMBULATORY_CARE_PROVIDER_SITE_OTHER): Payer: Commercial Managed Care - PPO | Admitting: Podiatrist

## 2021-09-09 ENCOUNTER — Ambulatory Visit (INDEPENDENT_AMBULATORY_CARE_PROVIDER_SITE_OTHER): Payer: Commercial Managed Care - PPO

## 2021-09-09 DIAGNOSIS — M7731 Calcaneal spur, right foot: Secondary | ICD-10-CM

## 2021-09-09 DIAGNOSIS — M722 Plantar fascial fibromatosis: Secondary | ICD-10-CM | POA: Diagnosis not present

## 2021-09-09 NOTE — Progress Notes (Signed)
Subjective: Christine Chen is a 47 y.o. female patient presents to office with complaint of moderate heel pain on the right heel. Patient admits to post static dyskinesia for several weeks in duration. Patient has treated this problem with tylenol or advil with no relief. Relates she has had surgery and  screws in her left ankle and feels she compensates when she walks.    Patient Active Problem List   Diagnosis Date Noted   Abnormal uterine bleeding 04/26/2014   Intramural leiomyoma of uterus 04/26/2014   Status post tubal ligation 07/24/2011    Current Outpatient Medications on File Prior to Visit  Medication Sig Dispense Refill   gabapentin (NEURONTIN) 100 MG capsule Take 200 mg by mouth 2 (two) times daily.     ibuprofen (ADVIL,MOTRIN) 600 MG tablet Take 1 tablet (600 mg total) by mouth every 6 (six) hours as needed. 30 tablet 0   oxyCODONE (OXY IR/ROXICODONE) 5 MG immediate release tablet Take 5 mg by mouth 3 (three) times daily as needed.     No current facility-administered medications on file prior to visit.    No Known Allergies  Objective: Physical Exam General: The patient is alert and oriented x3 in no acute distress.  Dermatology: Skin is warm, dry and supple bilateral lower extremities. Nails 1-10 are normal. There is no erythema, edema, no eccymosis, no open lesions present. Integument is otherwise unremarkable.  Vascular: Dorsalis Pedis pulse and Posterior Tibial pulse are 2/4 bilateral. Capillary fill time is immediate to all digits.  Neurological: Grossly intact to light touch with an achilles reflex of +2/5 and a  negative Tinel's sign bilateral.  Musculoskeletal: Tenderness to palpation at the medial calcaneal tubercale and through the insertion of the plantar fascia on the right foot. No pain with compression of calcaneus bilateral. No pain with tuning fork to calcaneus bilateral. No pain with calf compression bilateral. There is decreased Ankle joint range of  motion bilateral. All other joints range of motion within normal limits bilateral. Strength 5/5 in all groups bilateral.   Gait: Unassisted, Antalgic avoid weight on left heel  Xray, right foot:  Normal osseous mineralization. Joint spaces preserved. Hight arch foot type noted. No fracture/dislocation/boney destruction. Calcaneal spur present with mild thickening of plantar fascia. No other soft tissue abnormalities or radiopaque foreign bodies.   Assessment and Plan:   ICD-10-CM   1. Plantar fasciitis, right  M72.2 DG Foot Complete Right    CANCELED: DG Foot Complete Right        -Complete examination performed.  -Xrays reviewed -Discussed with patient in detail the condition of plantar fasciitis, how this occurs and general treatment options.  -After oral consent and aseptic prep, injected a mixture containing '20mg'$  Kenalog and '5mg'$  marcaine plain was infiltrated into the area of maximal tenderness of the right heel. Post-injection care discussed with patient.  -Recommended good supportive shoes and advised use of OTC insert. Explained to patient that if these orthoses work well, we will continue with these. If these do not improve his/her condition and  pain, we will consider custom molded orthoses. -Explained and dispensed to patient daily stretching exercises.. -Patient to return to office in 4 weeks if symptoms worsen or fail to improve.

## 2021-09-09 NOTE — Patient Instructions (Signed)

## 2021-09-15 ENCOUNTER — Ambulatory Visit
Admission: RE | Admit: 2021-09-15 | Discharge: 2021-09-15 | Disposition: A | Discharge status: Home / Self Care | Payer: Self-pay | Source: Ambulatory Visit | Attending: Family Medicine | Admitting: Family Medicine

## 2021-09-15 DIAGNOSIS — R109 Unspecified abdominal pain: Secondary | ICD-10-CM

## 2021-09-15 MED ORDER — IOPAMIDOL (ISOVUE-300) INJECTION 61%
100.0000 mL | Freq: Once | INTRAVENOUS | Status: AC | PRN
  Administered 2021-09-15: 100 mL via INTRAVENOUS

## 2021-10-12 ENCOUNTER — Ambulatory Visit: Payer: Commercial Managed Care - PPO | Admitting: Podiatry

## 2021-10-27 ENCOUNTER — Ambulatory Visit: Payer: Commercial Managed Care - PPO | Attending: Cardiology | Admitting: Cardiology

## 2021-10-27 ENCOUNTER — Encounter: Payer: Self-pay | Admitting: Cardiology

## 2021-10-27 DIAGNOSIS — R0609 Other forms of dyspnea: Secondary | ICD-10-CM | POA: Insufficient documentation

## 2021-10-27 DIAGNOSIS — E785 Hyperlipidemia, unspecified: Secondary | ICD-10-CM

## 2021-10-27 DIAGNOSIS — I4892 Unspecified atrial flutter: Secondary | ICD-10-CM

## 2021-10-27 DIAGNOSIS — R0600 Dyspnea, unspecified: Secondary | ICD-10-CM | POA: Diagnosis not present

## 2021-10-27 MED ORDER — METOPROLOL SUCCINATE ER 25 MG PO TB24: 25.0000 mg | ORAL_TABLET | Freq: Every day | ORAL | 3 refills | Status: AC
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
## 2021-10-27 NOTE — Patient Instructions (Addendum)

## 2021-10-27 NOTE — Addendum Note (Signed)
Addended by: Jacobo Forest D on: 10/27/2021 03:20 PM   Modules accepted: Orders

## 2021-11-09 ENCOUNTER — Ambulatory Visit: Payer: Commercial Managed Care - PPO

## 2021-11-18 ENCOUNTER — Ambulatory Visit: Payer: Commercial Managed Care - PPO | Attending: Cardiology

## 2021-11-18 DIAGNOSIS — R0600 Dyspnea, unspecified: Secondary | ICD-10-CM

## 2021-11-18 DIAGNOSIS — R0609 Other forms of dyspnea: Secondary | ICD-10-CM

## 2021-11-18 LAB — ECHOCARDIOGRAM COMPLETE
Area-P 1/2: 3.17 cm2
S' Lateral: 3.2 cm

## 2021-11-23 ENCOUNTER — Telehealth: Payer: Self-pay

## 2021-11-23 NOTE — Telephone Encounter (Signed)
Results reviewed with pt as per Dr. Krasowski's note.  Pt verbalized understanding and had no additional questions. Routed to PCP
